# Patient Record
Sex: Female | Born: 1980 | Race: Black or African American | Hispanic: No | Marital: Single | Smoking: Current every day smoker
Health system: Southern US, Community
[De-identification: ages and names within clinical notes are randomized; demographics above are authoritative.]

## PROBLEM LIST (undated history)

## (undated) ENCOUNTER — Emergency Department (HOSPITAL_COMMUNITY): Admission: EM | Payer: Self-pay | Source: Home / Self Care

## (undated) DIAGNOSIS — I10 Essential (primary) hypertension: Secondary | ICD-10-CM

## (undated) HISTORY — DX: Essential (primary) hypertension: I10

## (undated) HISTORY — PX: CHOLECYSTECTOMY: SHX55

---

## 2021-01-25 ENCOUNTER — Emergency Department (HOSPITAL_BASED_OUTPATIENT_CLINIC_OR_DEPARTMENT_OTHER): Payer: Self-pay

## 2021-01-25 ENCOUNTER — Emergency Department (HOSPITAL_BASED_OUTPATIENT_CLINIC_OR_DEPARTMENT_OTHER)
Admission: EM | Admit: 2021-01-25 | Discharge: 2021-01-25 | Disposition: A | Discharge status: Home / Self Care | Payer: Self-pay | Attending: Emergency Medicine | Admitting: Emergency Medicine

## 2021-01-25 ENCOUNTER — Other Ambulatory Visit: Payer: Self-pay

## 2021-01-25 ENCOUNTER — Encounter (HOSPITAL_BASED_OUTPATIENT_CLINIC_OR_DEPARTMENT_OTHER): Payer: Self-pay | Admitting: Emergency Medicine

## 2021-01-25 DIAGNOSIS — R102 Pelvic and perineal pain: Secondary | ICD-10-CM | POA: Insufficient documentation

## 2021-01-25 DIAGNOSIS — A599 Trichomoniasis, unspecified: Secondary | ICD-10-CM | POA: Insufficient documentation

## 2021-01-25 DIAGNOSIS — Z79899 Other long term (current) drug therapy: Secondary | ICD-10-CM | POA: Insufficient documentation

## 2021-01-25 DIAGNOSIS — R1900 Intra-abdominal and pelvic swelling, mass and lump, unspecified site: Secondary | ICD-10-CM

## 2021-01-25 DIAGNOSIS — F17210 Nicotine dependence, cigarettes, uncomplicated: Secondary | ICD-10-CM | POA: Insufficient documentation

## 2021-01-25 LAB — CBC WITH DIFFERENTIAL/PLATELET
Abs Immature Granulocytes: 0.06 10*3/uL (ref 0.00–0.07)
Basophils Absolute: 0.1 10*3/uL (ref 0.0–0.1)
Basophils Relative: 0 %
Eosinophils Absolute: 0.1 10*3/uL (ref 0.0–0.5)
Eosinophils Relative: 1 %
HCT: 36.3 % (ref 36.0–46.0)
Hemoglobin: 11.9 g/dL — ABNORMAL LOW (ref 12.0–15.0)
Immature Granulocytes: 0 %
Lymphocytes Relative: 23 %
Lymphs Abs: 3.1 10*3/uL (ref 0.7–4.0)
MCH: 29.6 pg (ref 26.0–34.0)
MCHC: 32.8 g/dL (ref 30.0–36.0)
MCV: 90.3 fL (ref 80.0–100.0)
Monocytes Absolute: 0.8 10*3/uL (ref 0.1–1.0)
Monocytes Relative: 6 %
Neutro Abs: 9.5 10*3/uL — ABNORMAL HIGH (ref 1.7–7.7)
Neutrophils Relative %: 70 %
Platelets: 548 10*3/uL — ABNORMAL HIGH (ref 150–400)
RBC: 4.02 MIL/uL (ref 3.87–5.11)
RDW: 12.9 % (ref 11.5–15.5)
WBC: 13.6 10*3/uL — ABNORMAL HIGH (ref 4.0–10.5)
nRBC: 0 % (ref 0.0–0.2)

## 2021-01-25 LAB — LIPASE, BLOOD: Lipase: 36 U/L (ref 11–51)

## 2021-01-25 LAB — WET PREP, GENITAL
Sperm: NONE SEEN
Yeast Wet Prep HPF POC: NONE SEEN

## 2021-01-25 LAB — COMPREHENSIVE METABOLIC PANEL
ALT: 14 U/L (ref 0–44)
AST: 12 U/L — ABNORMAL LOW (ref 15–41)
Albumin: 3.4 g/dL — ABNORMAL LOW (ref 3.5–5.0)
Alkaline Phosphatase: 60 U/L (ref 38–126)
Anion gap: 8 (ref 5–15)
BUN: 8 mg/dL (ref 6–20)
CO2: 26 mmol/L (ref 22–32)
Calcium: 9 mg/dL (ref 8.9–10.3)
Chloride: 102 mmol/L (ref 98–111)
Creatinine, Ser: 0.62 mg/dL (ref 0.44–1.00)
GFR, Estimated: >60 mL/min (ref >60)
Glucose, Bld: 94 mg/dL (ref 70–99)
Potassium: 3.6 mmol/L (ref 3.5–5.1)
Sodium: 136 mmol/L (ref 135–145)
Total Bilirubin: 0.3 mg/dL (ref 0.3–1.2)
Total Protein: 9.3 g/dL — ABNORMAL HIGH (ref 6.5–8.1)

## 2021-01-25 LAB — URINALYSIS, ROUTINE W REFLEX MICROSCOPIC
Glucose, UA: NEGATIVE mg/dL
Ketones, ur: NEGATIVE mg/dL
Nitrite: NEGATIVE
Protein, ur: NEGATIVE mg/dL
Specific Gravity, Urine: 1.02 (ref 1.005–1.030)
pH: 6 (ref 5.0–8.0)

## 2021-01-25 LAB — URINALYSIS, MICROSCOPIC (REFLEX)

## 2021-01-25 LAB — PREGNANCY, URINE: Preg Test, Ur: NEGATIVE

## 2021-01-25 MED ORDER — DOXYCYCLINE HYCLATE 100 MG PO CAPS: 100.0000 mg | ORAL_CAPSULE | Freq: Two times a day (BID) | ORAL | 0 refills | Status: DC

## 2021-01-25 MED ORDER — CEFTRIAXONE SODIUM 500 MG IJ SOLR
500.0000 mg | Freq: Once | INTRAMUSCULAR | Status: AC
  Administered 2021-01-25: 500 mg via INTRAMUSCULAR
  Filled 2021-01-25: qty 500

## 2021-01-25 MED ORDER — HYDROCODONE-ACETAMINOPHEN 5-325 MG PO TABS: 2.0000 | ORAL_TABLET | ORAL | 0 refills | Status: DC | PRN

## 2021-01-25 MED ORDER — DOXYCYCLINE HYCLATE 100 MG PO TABS
100.0000 mg | ORAL_TABLET | Freq: Once | ORAL | Status: AC
  Administered 2021-01-25: 100 mg via ORAL
  Filled 2021-01-25: qty 1

## 2021-01-25 MED ORDER — MORPHINE SULFATE (PF) 4 MG/ML IV SOLN
4.0000 mg | Freq: Once | INTRAVENOUS | Status: AC
Start: 2021-01-25 — End: 2021-01-25
  Administered 2021-01-25: 4 mg via INTRAVENOUS
  Filled 2021-01-25 (×2): qty 1

## 2021-01-25 MED ORDER — ACETAMINOPHEN 325 MG PO TABS
650.0000 mg | ORAL_TABLET | Freq: Once | ORAL | Status: AC
  Administered 2021-01-25: 650 mg via ORAL
  Filled 2021-01-25: qty 2

## 2021-01-25 MED ORDER — IOHEXOL 300 MG/ML  SOLN
100.0000 mL | Freq: Once | INTRAMUSCULAR | Status: AC | PRN
  Administered 2021-01-25: 100 mL via INTRAVENOUS

## 2021-01-25 MED ORDER — METRONIDAZOLE 500 MG PO TABS
500.0000 mg | ORAL_TABLET | Freq: Once | ORAL | Status: AC
  Administered 2021-01-25: 500 mg via ORAL
  Filled 2021-01-25: qty 1

## 2021-01-25 MED ORDER — GADOBUTROL 1 MMOL/ML IV SOLN
10.0000 mL | Freq: Once | INTRAVENOUS | Status: AC | PRN
  Administered 2021-01-25: 10 mL via INTRAVENOUS

## 2021-01-25 MED ORDER — METRONIDAZOLE 500 MG PO TABS: 500.0000 mg | ORAL_TABLET | Freq: Two times a day (BID) | ORAL | 0 refills | Status: DC

## 2021-01-25 MED ORDER — SODIUM CHLORIDE 0.9 % IV BOLUS
1000.0000 mL | Freq: Once | INTRAVENOUS | Status: AC
Start: 2021-01-25 — End: 2021-01-25
  Administered 2021-01-25: 1000 mL via INTRAVENOUS

## 2021-01-25 NOTE — Consult Note (Signed)
   OB/GYN Telephone Consult  01/25/2021  Christy Schaefer is a 40 y.o. female not pregnant presenting to [facility]. I was called for a consult regarding the care of this patient by [facility]                                                          .    The provider had a clinical question about follow up and management  The provider presented the following relevant clinical information: Pt presented with pelvic and abdominal pain.  No fever or chills.  No nausea or vomiting.  Did not mention any issues with bleeding.  No other acute complaints. Mostly concerned about PID due to exam findings (cervical motion tenderness).  However, when imaging was completed there was concern for potential neoplasm.  Requesting advice about next step/follow up.  I performed a chart review on the patient and reviewed available documentation.  BP 107/72 (BP Location: Left Arm)   Pulse 92   Temp 99 F (37.2 C) (Oral)   Resp 15   Ht 5\' 4"  (1.626 m)   Wt 136.1 kg   LMP 01/13/2021   SpO2 100%   BMI 51.49 kg/m   Exam- performed by consulting provider  03/15/2021 REVIEWED- -IMPRESSION: Complex solid and cystic pelvic mass filling the pelvis. Normal structures such as the uterus/endometrium and ovaries are not distinguishable from this mass. No definite free pelvic fluid.   Findings are concerning for neoplasm. Although pelvic inflammatory disease could have a similar appearance, it is felt to be much less likely. Recommend further evaluation with MRI. Recommend gynecology consult.  Recommendations:  -MRI ordered for further evaluation -plan to follow up with GYN early next week -will also reach out to GYN oncology on Monday once MRI is back about next step  -Recommended MD/APP provide the patient with a referral to the [facility] (any office) for follow up in  1 week.   Thank you for this consult and if additional recommendations are needed please call [phone] for  the OB/GYN attending on service at [facility].   I spent approximately 10 minutes directly consulting with the provider and verbally discussing this case. Additionally 10 minutes minutes was spent performing chart review and documentation.   [facility], DO Attending Obstetrician & Gynecologist, [facility] for [facility], [facility]     Criteria for phone consult billing? (If answer to any of these are yes then you cannot bill this telephone consult) Will the patient be seen urgently (within 24hrs) at a [facility] practice? No Is this a patient on which I performed surgery within the last 7d? No Have you billed a telephone consult on this patient in the last 7d? No

## 2021-01-25 NOTE — ED Provider Notes (Signed)
[facility] EMERGENCY DEPARTMENT Provider Note   CSN: [id number] Arrival date & time: 01/25/21  1157     History Chief Complaint  Patient presents with   Abdominal Pain    Christy Schaefer is a 40 y.o. female.  Patient has a past medical history of cholecystectomy. Patient presents with 2 days of suprapubic and right lower quadrant abdominal pain.  She says the patient's pain started gradual and was initially more in the middle of her lower abdomen, however the pain has gradually worsened and is now more so on the right side of her lower abdomen.  Rates the pain 8 out of 10.  Pain is worse when lying on left side and lying flat.  Has been taking Tylenol at home with no relief.  She has never had the symptoms before.  Her last menstrual period ended 3 days ago.  She has associated nausea with no vomiting.  Decreased p.o. intake.  She denies any associated fevers or chills, chest pain, shortness of breath, diarrhea, constipation.  She says that she has pressure when she urinates, denies dysuria or hematuria.  Denies any flank pain.  She denies any vaginal bleeding or discharge.   Abdominal Pain Associated symptoms: nausea   Associated symptoms: no chest pain, no chills, no constipation, no cough, no diarrhea, no dysuria, no fever, no hematuria, no shortness of breath, no sore throat, no vaginal bleeding, no vaginal discharge and no vomiting       History reviewed. No pertinent past medical history.  There are no problems to display for this patient.   Past Surgical History:  Procedure Laterality Date   CHOLECYSTECTOMY       OB History   No obstetric history on file.     No family history on file.  Social History   Tobacco Use   Smoking status: Every Day    Types: Cigarettes   Smokeless tobacco: Never  Vaping Use   Vaping Use: Never used  Substance Use Topics   Alcohol use: Yes    Comment: rare   Drug use: Never    Home Medications Prior to Admission  medications   Medication Sig Start Date End Date Taking? Authorizing Provider  doxycycline (VIBRAMYCIN) 100 MG capsule Take 1 capsule (100 mg total) by mouth 2 (two) times daily for 14 days. 01/25/21 02/08/21 Yes Ezariah Nace, Finis Bud, PA-C  HYDROcodone-acetaminophen (NORCO/VICODIN) 5-325 MG tablet Take 2 tablets by mouth every 4 (four) hours as needed for up to 5 days. 01/25/21 01/30/21 Yes Javious Hallisey, Finis Bud, PA-C  metroNIDAZOLE (FLAGYL) 500 MG tablet Take 1 tablet (500 mg total) by mouth 2 (two) times daily for 14 days. 01/25/21 02/08/21 Yes Salaya Holtrop, Finis Bud, PA-C    Allergies    Patient has no known allergies.  Review of Systems   Review of Systems  Constitutional:  Negative for chills and fever.  HENT:  Negative for congestion, rhinorrhea and sore throat.   Eyes:  Negative for visual disturbance.  Respiratory:  Negative for cough, chest tightness and shortness of breath.   Cardiovascular:  Negative for chest pain, palpitations and leg swelling.  Gastrointestinal:  Positive for abdominal pain and nausea. Negative for abdominal distention, blood in stool, constipation, diarrhea and vomiting.  Genitourinary:  Positive for difficulty urinating and pelvic pain. Negative for decreased urine volume, dysuria, flank pain, hematuria, menstrual problem, vaginal bleeding and vaginal discharge.  Musculoskeletal:  Negative for back pain.  Skin:  Negative for rash and wound.  Neurological:  Negative  for dizziness, syncope, weakness, light-headedness and headaches.  Psychiatric/Behavioral:  Negative for confusion.   All other systems reviewed and are negative.  Physical Exam Updated Vital Signs BP 109/81   Pulse 96   Temp 99 F (37.2 C) (Oral)   Resp 18   Ht 5\' 4"  (1.626 m)   Wt 136.1 kg   LMP 01/13/2021   SpO2 100%   BMI 51.49 kg/m   Physical Exam Vitals and nursing note reviewed.  Constitutional:      General: She is not in acute distress.    Appearance: Normal appearance. She is obese.  She is not ill-appearing, toxic-appearing or diaphoretic.     Comments: Patient is tearful due to pain  HENT:     Head: Normocephalic and atraumatic.     Nose: No nasal deformity.     Mouth/Throat:     Lips: Pink. No lesions.     Mouth: No injury, lacerations, oral lesions or angioedema.     Pharynx: Uvula midline. No uvula swelling.  Eyes:     General: Gaze aligned appropriately. No scleral icterus.       Right eye: No discharge.        Left eye: No discharge.     Conjunctiva/sclera: Conjunctivae normal.     Right eye: Right conjunctiva is not injected. No exudate or hemorrhage.    Left eye: Left conjunctiva is not injected. No exudate or hemorrhage. Cardiovascular:     Rate and Rhythm: Regular rhythm. Tachycardia present.     Pulses: Normal pulses.          Radial pulses are 2+ on the right side and 2+ on the left side.       Dorsalis pedis pulses are 2+ on the right side and 2+ on the left side.     Heart sounds: Normal heart sounds, S1 normal and S2 normal. Heart sounds not distant. No murmur heard.   No friction rub. No gallop. No S3 or S4 sounds.  Pulmonary:     Effort: Pulmonary effort is normal. No accessory muscle usage or respiratory distress.     Breath sounds: Normal breath sounds. No stridor. No wheezing, rhonchi or rales.  Chest:     Chest wall: No tenderness.  Abdominal:     General: Abdomen is flat. Bowel sounds are normal. There is no distension.     Palpations: Abdomen is soft. There is no shifting dullness, fluid wave, hepatomegaly, splenomegaly, mass or pulsatile mass.     Tenderness: There is abdominal tenderness in the right lower quadrant and suprapubic area. There is guarding and rebound. There is no right CVA tenderness or left CVA tenderness. Positive signs include Rovsing's sign and McBurney's sign. Negative signs include Murphy's sign, psoas sign and obturator sign.     Comments: Patient has pretty significant guarding or rigidity to palpation of right  lower quadrant and suprapubic area.  She appears to be grimacing as I press down.  She does have some generalized abdominal tenderness in other fields, however not as severe.  Musculoskeletal:     Right lower leg: No edema.     Left lower leg: No edema.  Skin:    General: Skin is warm and dry.     Coloration: Skin is not jaundiced or pale.     Findings: No bruising, erythema, lesion or rash.  Neurological:     General: No focal deficit present.     Mental Status: She is alert and oriented to person, place, and  time.     GCS: GCS eye subscore is 4. GCS verbal subscore is 5. GCS motor subscore is 6.  Psychiatric:        Mood and Affect: Mood normal.        Behavior: Behavior normal. Behavior is cooperative.    ED Results / Procedures / Treatments   Labs (all labs ordered are listed, but only abnormal results are displayed) Labs Reviewed  WET PREP, GENITAL - Abnormal; Notable for the following components:      Result Value   Trich, Wet Prep PRESENT (*)    Clue Cells Wet Prep HPF POC PRESENT (*)    WBC, Wet Prep HPF POC MANY (*)    All other components within normal limits  URINALYSIS, ROUTINE W REFLEX MICROSCOPIC - Abnormal; Notable for the following components:   Hgb urine dipstick SMALL (*)    Bilirubin Urine SMALL (*)    Leukocytes,Ua SMALL (*)    All other components within normal limits  COMPREHENSIVE METABOLIC PANEL - Abnormal; Notable for the following components:   Total Protein 9.3 (*)    Albumin 3.4 (*)    AST 12 (*)    All other components within normal limits  CBC WITH DIFFERENTIAL/PLATELET - Abnormal; Notable for the following components:   WBC 13.6 (*)    Hemoglobin 11.9 (*)    Platelets 548 (*)    Neutro Abs 9.5 (*)    All other components within normal limits  URINALYSIS, MICROSCOPIC (REFLEX) - Abnormal; Notable for the following components:   Bacteria, UA MANY (*)    Trichomonas, UA PRESENT (*)    All other components within normal limits  PREGNANCY, URINE   LIPASE, BLOOD  CA 125  GC/CHLAMYDIA PROBE AMP ([place]) NOT AT [facility]    EKG None  Radiology CT Abdomen Pelvis W Contrast  Result Date: 01/25/2021 CLINICAL DATA:  RIGHT LOWER QUADRANT abdominal pain. Suspected appendicitis. EXAM: CT ABDOMEN AND PELVIS WITH CONTRAST TECHNIQUE: Multidetector CT imaging of the abdomen and pelvis was performed using the standard protocol following bolus administration of intravenous contrast. CONTRAST:  OMNIPAQUE IOHEXOL 300 MG/ML  SOLN COMPARISON:  05/16/2018 FINDINGS: Lower chest: Subsegmental atelectasis at both lung bases. Hepatobiliary: Prior cholecystectomy. There is a circumscribed low-attenuation lesion in the LEFT hepatic lobe adjacent to the falciform ligament which measures 3.5 centimeters (image 32 of series 2). Although this may represent focal fat adjacent to the falciform ligament, further evaluation is indicated. Pancreas: Unremarkable. No pancreatic ductal dilatation or surrounding inflammatory changes. Spleen: Normal in size without focal abnormality. Adrenals/Urinary Tract: Adrenal glands are normal. Kidneys are normal. Ureters are unremarkable. Bladder is decompressed and thick-walled. Stomach/Bowel: Stomach is normal in appearance. Small bowel loops are unremarkable. The appendix is well seen and normal in appearance. Colon is unremarkable. Vascular/Lymphatic: There is normal vascular opacification of the celiac axis, superior mesenteric artery, and inferior mesenteric artery. Normal appearance of the portal venous system and inferior vena cava. There are numerous enlarged retroperitoneal lymph nodes, largest in the LEFT periaortic region measuring 1.4 centimeters on image 39 of series 2. Enlarged lymph nodes identified in the RIGHT internal iliac chain, largest 1.0 centimeters on image 70 of series 2. There is mild stranding in the retroperitoneal para-aortic fat. Reproductive: Intrauterine device has been removed. There is a large complex  mass within the pelvis which includes the uterus. Multiple thick-walled confluent collections measure up to 10.2 x 6.3 x 9.1 centimeters. Normal ovarian tissue is not discrete from this mass. Measured  together, these confluent low-attenuation masses and the uterus span 14.8 x 10.2 x 20.9 centimeters. The appearance is progressive compared with the prior study. Other: Possible small amount of ascites versus fluid-filled structure in the cul-de-sac. Abdominal wall is unremarkable. Musculoskeletal: No acute or significant osseous findings. IMPRESSION: 1. Large confluent pelvic mass including the uterus and numerous thick walled cystic masses which measure up to 10 centimeters in diameter. Normal ovarian tissue is difficult to identify discrete from this confluent mass. Findings are suspicious for neoplasm. Less likely, progressive inflammatory/infectious process could have this appearance. Recommend GYN referral and possible MRI. Consider ultrasound in the acute setting. 2. 3.5 centimeter low-attenuation lesion in the LEFT hepatic lobe warranting further characterization. Recommend MRI of the abdomen with liver protocol. 3. Numerous enlarged retroperitoneal lymph nodes suspicious for metastatic disease. 4. Normal appendix. 5. Cholecystectomy. These results were called by telephone at the time of interpretation on 01/25/2021 at 2:50 pm to the ED MD, who verbally acknowledged these results. Electronically Signed   By: Norva Pavlov M.D.   On: 01/25/2021 14:51   US PELVIC COMPLETE W TRANSVAGINAL AND TORSION R/O  Result Date: 01/25/2021 CLINICAL DATA:  Pelvic pain, pelvic inflammatory disease. Abscess/mass seen on CT exam. LMP 01/10/2021 EXAM: TRANSABDOMINAL AND TRANSVAGINAL ULTRASOUND OF PELVIS TECHNIQUE: Both transabdominal and transvaginal ultrasound examinations of the pelvis were performed. Transabdominal technique was performed for global imaging of the pelvis including uterus, ovaries, adnexal regions, and  pelvic cul-de-sac. It was necessary to proceed with endovaginal exam following the transabdominal exam to visualize the uterus, endometrium, ovaries and adnexal regions. COMPARISON:  CT of the abdomen and pelvis on 01/25/2021, ultrasound of the pelvis on 05/12/2018 FINDINGS: Uterus Measurements: The uterus is indistinguishable from large pelvic mass containing numerous complex cystic structures, as seen on CT exam. Endometrium Thickness: Not identified. Right ovary Measurements: Indistinguishable from large complex pelvic mass. Left ovary Measurements: Indistinguishable from large complex pelvic mass. Other findings No free pelvic fluid. IMPRESSION: Complex solid and cystic pelvic mass filling the pelvis. Normal structures such as the uterus/endometrium and ovaries are not distinguishable from this mass. No definite free pelvic fluid. Findings are concerning for neoplasm. Although pelvic inflammatory disease could have a similar appearance, it is felt to be much less likely. Recommend further evaluation with MRI. Recommend gynecology consult. Electronically Signed   By: Norva Pavlov M.D.   On: 01/25/2021 16:17    Procedures Procedures   Medications Ordered in ED Medications  acetaminophen (TYLENOL) tablet 650 mg (650 mg Oral Given 01/25/21 1312)  sodium chloride 0.9 % bolus 1,000 mL ( Intravenous Stopped 01/25/21 1453)  iohexol (OMNIPAQUE) 300 MG/ML solution 100 mL (100 mLs Intravenous Contrast Given 01/25/21 1413)  cefTRIAXone (ROCEPHIN) injection 500 mg (500 mg Intramuscular Given 01/25/21 1636)  doxycycline (VIBRA-TABS) tablet 100 mg (100 mg Oral Given 01/25/21 1629)  metroNIDAZOLE (FLAGYL) tablet 500 mg (500 mg Oral Given 01/25/21 1629)  morphine 4 MG/ML injection 4 mg (4 mg Intravenous Given 01/25/21 1846)  gadobutrol (GADAVIST) 1 MMOL/ML injection 10 mL (10 mLs Intravenous Contrast Given 01/25/21 1835)    ED Course  I have reviewed the triage vital signs and the nursing  notes.  Pertinent labs & imaging results that were available during my care of the patient were reviewed by me and considered in my medical decision making (see chart for details).  Clinical Course as of 01/25/21 1943  Sat Jan 25, 2021  1359 Leukocytosis, u preg negative [GL]  1359 Mild anemia.  Thrombocytosis.   [  GL]  1523  Large confluent pelvic mass including the uterus and numerous thick walled cystic masses which measure up to 10 centimeters in diameter. Normal ovarian tissue is difficult to identify discrete from this confluent mass. Findings are suspicious for neoplasm. Less likely, progressive inflammatory/infectious process could have this appearance. Recommend GYN referral and possible MRI. Consider ultrasound in the acute setting.   [GL]  1524 3.5 centimeter low-attenuation lesion in the LEFT hepatic lobe warranting further characterization. Recommend MRI of the abdomen with liver protocol.   [GL]  1524 Numerous enlarged retroperitoneal lymph nodes suspicious for metastatic disease.   [GL]  1524 Pelvic exam with Cervical motion tenderness. No purulent discharge. Wet prep, Gon/Chlamydia obtained [GL]  1742 Spoke with Dr. Charlotta Newton with GYN. She favors TOA versus neoplasm based on patient's age. She recommends trial of discharge on two weeks of antibiotics with close OBGYN f/u. Strict return precautions for failure of outpatient therapy [GL]    Clinical Course User Index [GL] Noralee Dutko, Finis Bud, PA-C   MDM Rules/Calculators/A&P                         Initially presented with 2 days of worsening pelvic and right lower quadrant abdominal pain. Patient with tachycardia to 118, low-grade temp of 99.  Some signs of hypovolemia.  Will rehydrate.  Peritoneal signs of palpation of right lower quadrant and suprapubic area concerning for possible appendicitis or ovarian etiology.  Also want to rule out urinary tract infection. Ordered U preg, UA, CMP, lipase, CBC, CT abdomen pelvis.   Treat pain with Tylenol for now.  We will give other medication following pregnancy test.  Give 1 L IV fluid bolus.   I personally reviewed all laboratory work and imaging.  -CBC with mild leukocytosis.  Stable anemia from baseline.  CMP with no kidney abnormality.  Liver enzymes look okay.  Electrolytes normal.  Face normal. -Pregnancy negative -Urine with small amount of leukocytes, however not super concerning for urinary tract infection, however she did test positive for trichomonas in her urine. -CT scan was specifically concerning.  A large confluent pelvic mass including the uterus and numerous thick-walled cystic masses which measure up to 10 cm in diameter were found.  Normal ovarian tissue was difficult to identify discrete from this confluent mass.  Findings are suspicious for neoplasm.  However, inflammatory or infectious process cannot be excluded.  Also findings are concerning for 3.5 cm low-attenuation lesion in the left hepatic lobe that needs further characterization.  Patient also had numerous enlarged retroperitoneal lymph nodes that are suspicious for metastatic disease.  Performed pelvic exam with cervical motion tenderness.  No large amount of purulent discharge.  Wet prep gonorrhea and chlamydia were obtained.  , Wet prep positive for trichomonas and clue cells.  I have started patient on an IM injection of 500 mg Rocephin, as well as started on doxycycline and Flagyl to treat infection.  Given these findings, I am concerned for pelvic inflammatory disease, however I am also concerned for metastatic cancerous mass versus ovarian abscess.  Will obtain transvaginal and pelvic ultrasound to further characterize the mass found on CT scan. Ordered CA 125. This will be a send out and will not result today.  , Ultrasound concerning for complex solid and cystic pelvic mass filling the felt pelvis.  No free pelvic fluid noted.  Findings were concerning for neoplasm although PID  could have similar appearance. recommend MRI. MRI ordered.   , I  spoke with Dr. Charlotta Newton with GYN.  Discussed this case with her, and she favors tubo-ovarian abscess over neoplasm due to patient's age and presenting symptoms.  Since patient is hemodynamically stable, she feels that a trial of outpatient oral antibiotic therapy is appropriate with close OB/GYN follow-up.  Strict return precautions given.  On reassessment, pain is improved with IV morphine.  Tachycardia was improved after fluid ministration.  I discussed these results and the plan with the patient.  She plans to call OB/GYN office on Monday morning as soon as she can.  She understands that she may develop worsening symptoms, and that she needs to return to the emergency department for possibility of IV antibiotics and admission.  I discussed that patient still has pending chlamydia and gonorrhea testing.  She is already been treated for this, however someone will call her if these return positive and she needs to alert any sexual partners of these results as they would also need to be treated.  At the time of discharge, MRI results are still pending.  Spoke with radiologist and these would not be read until tomorrow morning.  It would be available for patient's OB/GYN to review.  Even though we favor PID with POA over neoplasm, this does not exclude possibility of underlying malignancy being the cause of patient's symptoms, so it is very important for her to have close follow-up for reevaluation..  Portions of this note were generated with Scientist, clinical (histocompatibility and immunogenetics). Dictation errors may occur despite best attempts at proofreading.   Final Clinical Impression(s) / ED Diagnoses Final diagnoses:  Trichomonas infection  Pelvic pain in female    Rx / DC Orders ED Discharge Orders          Ordered    doxycycline (VIBRAMYCIN) 100 MG capsule  2 times daily        01/25/21 1757    metroNIDAZOLE (FLAGYL) 500 MG tablet  2 times daily         01/25/21 1757    HYDROcodone-acetaminophen (NORCO/VICODIN) 5-325 MG tablet  Every 4 hours PRN        01/25/21 1757             Claudie Leach, PA-C 01/25/21 1943    Virgina Norfolk, DO 01/25/21 2035

## 2021-01-25 NOTE — Discharge Instructions (Addendum)
We are treating you for an infection called Pelvic Inflammatory Disease. We found some concerning findings on imaging that were concerning for an abscess or a pelvic mass.   [facility] need to call [facility] first thing Monday morning and set up an appointment as early as you can.   Please take all of your antibiotics until finished!   You may develop abdominal discomfort or diarrhea from the antibiotic.  You may help offset this with probiotics which you can buy or get in yogurt. Do not eat  or take the probiotics until 2 hours after your antibiotic.  If you are not improving and continue to worsen, you should return to the Emergency Department for consideration of possible admission.

## 2021-01-25 NOTE — ED Notes (Addendum)
Correction- pt was in [facility]

## 2021-01-25 NOTE — ED Triage Notes (Signed)
Pt c/o lower abdominal pain since Thursday. Pt pain with urination and bowel movements. Pt also has loss of appetite.

## 2021-01-25 NOTE — ED Notes (Signed)
Pt currently in US.

## 2021-01-25 NOTE — ED Notes (Signed)
Patient transported to MRI 

## 2021-01-27 ENCOUNTER — Encounter (HOSPITAL_BASED_OUTPATIENT_CLINIC_OR_DEPARTMENT_OTHER): Payer: Self-pay

## 2021-01-27 ENCOUNTER — Other Ambulatory Visit: Payer: Self-pay

## 2021-01-27 DIAGNOSIS — R1031 Right lower quadrant pain: Secondary | ICD-10-CM | POA: Insufficient documentation

## 2021-01-27 DIAGNOSIS — Z5321 Procedure and treatment not carried out due to patient leaving prior to being seen by health care provider: Secondary | ICD-10-CM | POA: Insufficient documentation

## 2021-01-27 LAB — URINALYSIS, ROUTINE W REFLEX MICROSCOPIC
Bilirubin Urine: NEGATIVE
Glucose, UA: NEGATIVE mg/dL
Ketones, ur: NEGATIVE mg/dL
Nitrite: NEGATIVE
Protein, ur: 30 mg/dL — AB
Specific Gravity, Urine: 1.02 (ref 1.005–1.030)
pH: 6.5 (ref 5.0–8.0)

## 2021-01-27 LAB — COMPREHENSIVE METABOLIC PANEL
ALT: 12 U/L (ref 0–44)
AST: 12 U/L — ABNORMAL LOW (ref 15–41)
Albumin: 3.2 g/dL — ABNORMAL LOW (ref 3.5–5.0)
Alkaline Phosphatase: 55 U/L (ref 38–126)
Anion gap: 9 (ref 5–15)
BUN: 7 mg/dL (ref 6–20)
CO2: 25 mmol/L (ref 22–32)
Calcium: 8.9 mg/dL (ref 8.9–10.3)
Chloride: 102 mmol/L (ref 98–111)
Creatinine, Ser: 0.57 mg/dL (ref 0.44–1.00)
GFR, Estimated: >60 mL/min (ref >60)
Glucose, Bld: 101 mg/dL — ABNORMAL HIGH (ref 70–99)
Potassium: 3 mmol/L — ABNORMAL LOW (ref 3.5–5.1)
Sodium: 136 mmol/L (ref 135–145)
Total Bilirubin: 0.3 mg/dL (ref 0.3–1.2)
Total Protein: 9.2 g/dL — ABNORMAL HIGH (ref 6.5–8.1)

## 2021-01-27 LAB — CBC
HCT: 34.8 % — ABNORMAL LOW (ref 36.0–46.0)
Hemoglobin: 11.3 g/dL — ABNORMAL LOW (ref 12.0–15.0)
MCH: 29 pg (ref 26.0–34.0)
MCHC: 32.5 g/dL (ref 30.0–36.0)
MCV: 89.5 fL (ref 80.0–100.0)
Platelets: 393 10*3/uL (ref 150–400)
RBC: 3.89 MIL/uL (ref 3.87–5.11)
RDW: 12.8 % (ref 11.5–15.5)
WBC: 12.5 10*3/uL — ABNORMAL HIGH (ref 4.0–10.5)
nRBC: 0 % (ref 0.0–0.2)

## 2021-01-27 LAB — GC/CHLAMYDIA PROBE AMP (~~LOC~~) NOT AT ARMC
Chlamydia: NEGATIVE
Comment: NEGATIVE
Comment: NORMAL
Neisseria Gonorrhea: NEGATIVE

## 2021-01-27 LAB — LIPASE, BLOOD: Lipase: 32 U/L (ref 11–51)

## 2021-01-27 LAB — URINALYSIS, MICROSCOPIC (REFLEX)

## 2021-01-27 LAB — PREGNANCY, URINE: Preg Test, Ur: NEGATIVE

## 2021-01-27 NOTE — ED Triage Notes (Signed)
Pt c/o right lower abd pain started 10/20-states she was seen here 2 day ago-dx with "pelvic inflammatory disease"-states she is taking abx-feels worse-NAD-steady gait

## 2021-01-28 ENCOUNTER — Telehealth: Payer: Self-pay | Admitting: General Practice

## 2021-01-28 ENCOUNTER — Emergency Department (HOSPITAL_BASED_OUTPATIENT_CLINIC_OR_DEPARTMENT_OTHER)
Admission: EM | Admit: 2021-01-28 | Discharge: 2021-01-28 | Disposition: A | Discharge status: Home / Self Care | Payer: Self-pay | Attending: Emergency Medicine | Admitting: Emergency Medicine

## 2021-01-28 LAB — CA 125: Cancer Antigen (CA) 125: 11.9 U/mL (ref 0.0–38.1)

## 2021-01-28 NOTE — Telephone Encounter (Signed)
-----   Message from Levie Heritage, DO sent at 01/27/2021  3:37 PM EDT ----- Regarding: f/u appt. I'm sorry if this is a duplicate - this patient needs a follow up appt in the office later this week. Okay to overbook.  JS

## 2021-01-29 ENCOUNTER — Ambulatory Visit (INDEPENDENT_AMBULATORY_CARE_PROVIDER_SITE_OTHER): Payer: Self-pay | Admitting: Obstetrics & Gynecology

## 2021-01-29 ENCOUNTER — Other Ambulatory Visit: Payer: Self-pay

## 2021-01-29 ENCOUNTER — Other Ambulatory Visit: Payer: Self-pay | Admitting: Obstetrics & Gynecology

## 2021-01-29 ENCOUNTER — Encounter: Payer: Self-pay | Admitting: Obstetrics & Gynecology

## 2021-01-29 ENCOUNTER — Other Ambulatory Visit (HOSPITAL_COMMUNITY)
Admission: RE | Admit: 2021-01-29 | Discharge: 2021-01-29 | Disposition: A | Discharge status: Home / Self Care | Payer: Self-pay | Source: Ambulatory Visit | Attending: Obstetrics & Gynecology | Admitting: Obstetrics & Gynecology

## 2021-01-29 ENCOUNTER — Inpatient Hospital Stay (HOSPITAL_COMMUNITY)
Admission: AD | Admit: 2021-01-29 | Discharge: 2021-02-05 | DRG: 757 | Disposition: A | Discharge status: Home / Self Care | Payer: Self-pay | Attending: Family Medicine | Admitting: Family Medicine

## 2021-01-29 ENCOUNTER — Encounter (HOSPITAL_COMMUNITY): Payer: Self-pay | Admitting: Family Medicine

## 2021-01-29 VITALS — BP 120/88 | HR 107 | Ht 65.0 in | Wt 233.1 lb

## 2021-01-29 DIAGNOSIS — N739 Female pelvic inflammatory disease, unspecified: Secondary | ICD-10-CM | POA: Insufficient documentation

## 2021-01-29 DIAGNOSIS — Z9049 Acquired absence of other specified parts of digestive tract: Secondary | ICD-10-CM

## 2021-01-29 DIAGNOSIS — Z6841 Body Mass Index (BMI) 40.0 and over, adult: Secondary | ICD-10-CM

## 2021-01-29 DIAGNOSIS — R188 Other ascites: Secondary | ICD-10-CM

## 2021-01-29 DIAGNOSIS — B3731 Acute candidiasis of vulva and vagina: Secondary | ICD-10-CM | POA: Diagnosis present

## 2021-01-29 DIAGNOSIS — N7093 Salpingitis and oophoritis, unspecified: Principal | ICD-10-CM | POA: Diagnosis present

## 2021-01-29 DIAGNOSIS — A599 Trichomoniasis, unspecified: Secondary | ICD-10-CM | POA: Diagnosis present

## 2021-01-29 DIAGNOSIS — A5901 Trichomonal vulvovaginitis: Secondary | ICD-10-CM | POA: Diagnosis present

## 2021-01-29 DIAGNOSIS — R102 Pelvic and perineal pain: Secondary | ICD-10-CM | POA: Diagnosis present

## 2021-01-29 DIAGNOSIS — I10 Essential (primary) hypertension: Secondary | ICD-10-CM | POA: Clinically undetermined

## 2021-01-29 DIAGNOSIS — F17210 Nicotine dependence, cigarettes, uncomplicated: Secondary | ICD-10-CM | POA: Diagnosis present

## 2021-01-29 DIAGNOSIS — K651 Peritoneal abscess: Secondary | ICD-10-CM

## 2021-01-29 DIAGNOSIS — L0291 Cutaneous abscess, unspecified: Secondary | ICD-10-CM

## 2021-01-29 LAB — COMPREHENSIVE METABOLIC PANEL
ALT: 15 U/L (ref 0–44)
AST: 17 U/L (ref 15–41)
Albumin: 2.9 g/dL — ABNORMAL LOW (ref 3.5–5.0)
Alkaline Phosphatase: 54 U/L (ref 38–126)
Anion gap: 11 (ref 5–15)
BUN: 6 mg/dL (ref 6–20)
CO2: 25 mmol/L (ref 22–32)
Calcium: 9 mg/dL (ref 8.9–10.3)
Chloride: 102 mmol/L (ref 98–111)
Creatinine, Ser: 0.69 mg/dL (ref 0.44–1.00)
GFR, Estimated: >60 mL/min (ref >60)
Glucose, Bld: 88 mg/dL (ref 70–99)
Potassium: 3.1 mmol/L — ABNORMAL LOW (ref 3.5–5.1)
Sodium: 138 mmol/L (ref 135–145)
Total Bilirubin: 0.5 mg/dL (ref 0.3–1.2)
Total Protein: 8.9 g/dL — ABNORMAL HIGH (ref 6.5–8.1)

## 2021-01-29 LAB — DIFFERENTIAL
Abs Immature Granulocytes: 0.04 10*3/uL (ref 0.00–0.07)
Basophils Absolute: 0.1 10*3/uL (ref 0.0–0.1)
Basophils Relative: 0 %
Eosinophils Absolute: 0.1 10*3/uL (ref 0.0–0.5)
Eosinophils Relative: 1 %
Immature Granulocytes: 0 %
Lymphocytes Relative: 24 %
Lymphs Abs: 3.3 10*3/uL (ref 0.7–4.0)
Monocytes Absolute: 0.9 10*3/uL (ref 0.1–1.0)
Monocytes Relative: 7 %
Neutro Abs: 9.3 10*3/uL — ABNORMAL HIGH (ref 1.7–7.7)
Neutrophils Relative %: 68 %

## 2021-01-29 LAB — CBC
HCT: 35.3 % — ABNORMAL LOW (ref 36.0–46.0)
Hemoglobin: 11.6 g/dL — ABNORMAL LOW (ref 12.0–15.0)
MCH: 29.4 pg (ref 26.0–34.0)
MCHC: 32.9 g/dL (ref 30.0–36.0)
MCV: 89.4 fL (ref 80.0–100.0)
Platelets: 507 10*3/uL — ABNORMAL HIGH (ref 150–400)
RBC: 3.95 MIL/uL (ref 3.87–5.11)
RDW: 12.6 % (ref 11.5–15.5)
WBC: 13.7 10*3/uL — ABNORMAL HIGH (ref 4.0–10.5)
nRBC: 0 % (ref 0.0–0.2)

## 2021-01-29 LAB — C-REACTIVE PROTEIN: CRP: 11 mg/dL — ABNORMAL HIGH (ref <1.0)

## 2021-01-29 MED ORDER — SENNOSIDES-DOCUSATE SODIUM 8.6-50 MG PO TABS: 1.0000 | ORAL_TABLET | Freq: Every evening | ORAL | Status: DC | PRN

## 2021-01-29 MED ORDER — LACTATED RINGERS IV SOLN: INTRAVENOUS | Status: DC

## 2021-01-29 MED ORDER — SODIUM CHLORIDE 0.9 % IV SOLN: 3.0000 g | Freq: Four times a day (QID) | INTRAVENOUS | Status: DC

## 2021-01-29 MED ORDER — PRENATAL MULTIVITAMIN CH
1.0000 | ORAL_TABLET | Freq: Every day | ORAL | Status: DC
  Administered 2021-01-31 – 2021-02-05 (×4): 1 via ORAL
  Filled 2021-01-29 (×5): qty 1

## 2021-01-29 MED ORDER — ALUM & MAG HYDROXIDE-SIMETH 200-200-20 MG/5ML PO SUSP
15.0000 mL | ORAL | Status: DC | PRN
  Filled 2021-01-29: qty 30

## 2021-01-29 MED ORDER — HYDROMORPHONE HCL 1 MG/ML IJ SOLN
1.0000 mg | INTRAMUSCULAR | Status: DC | PRN
Start: 2021-01-29 — End: 2021-02-05
  Administered 2021-02-02 – 2021-02-04 (×7): 1 mg via INTRAVENOUS
  Filled 2021-01-29 (×8): qty 1

## 2021-01-29 MED ORDER — OXYCODONE-ACETAMINOPHEN 5-325 MG PO TABS
1.0000 | ORAL_TABLET | ORAL | Status: DC | PRN
  Administered 2021-01-29 – 2021-02-02 (×9): 2 via ORAL
  Administered 2021-02-03: 1 via ORAL
  Administered 2021-02-04 – 2021-02-05 (×5): 2 via ORAL
  Filled 2021-01-29 (×13): qty 2
  Filled 2021-01-29 (×2): qty 1
  Filled 2021-01-29: qty 2

## 2021-01-29 MED ORDER — METRONIDAZOLE 500 MG/100ML IV SOLN
500.0000 mg | Freq: Four times a day (QID) | INTRAVENOUS | Status: DC
  Administered 2021-01-29 – 2021-01-30 (×2): 500 mg via INTRAVENOUS
  Filled 2021-01-29 (×4): qty 100

## 2021-01-29 MED ORDER — KETOROLAC TROMETHAMINE 30 MG/ML IJ SOLN
30.0000 mg | Freq: Four times a day (QID) | INTRAMUSCULAR | Status: DC
  Administered 2021-01-29 – 2021-02-01 (×9): 30 mg via INTRAVENOUS
  Filled 2021-01-29 (×10): qty 1

## 2021-01-29 MED ORDER — ZOLPIDEM TARTRATE 5 MG PO TABS
5.0000 mg | ORAL_TABLET | Freq: Every evening | ORAL | Status: DC | PRN
  Administered 2021-02-01: 5 mg via ORAL
  Filled 2021-01-29: qty 1

## 2021-01-29 MED ORDER — HYDROMORPHONE HCL 1 MG/ML IJ SOLN
0.5000 mg | INTRAMUSCULAR | Status: DC | PRN
Start: 2021-01-29 — End: 2021-01-29

## 2021-01-29 MED ORDER — KETOROLAC TROMETHAMINE 30 MG/ML IJ SOLN
30.0000 mg | Freq: Four times a day (QID) | INTRAMUSCULAR | Status: DC
Start: 2021-01-29 — End: 2021-02-01

## 2021-01-29 MED ORDER — DOXYCYCLINE HYCLATE 100 MG IV SOLR
100.0000 mg | Freq: Two times a day (BID) | INTRAVENOUS | Status: DC
Start: 2021-01-29 — End: 2021-02-01
  Administered 2021-01-29 – 2021-02-01 (×6): 100 mg via INTRAVENOUS
  Filled 2021-01-29 (×7): qty 100

## 2021-01-29 MED ORDER — PRENATAL MULTIVITAMIN CH: 1.0000 | ORAL_TABLET | Freq: Every day | ORAL | Status: DC

## 2021-01-29 MED ORDER — METRONIDAZOLE 500 MG/100ML IV SOLN
500.0000 mg | Freq: Four times a day (QID) | INTRAVENOUS | Status: DC
  Filled 2021-01-29 (×3): qty 100

## 2021-01-29 MED ORDER — SODIUM CHLORIDE 0.9 % IV SOLN
1.0000 g | Freq: Two times a day (BID) | INTRAVENOUS | Status: DC
  Administered 2021-01-29: 1 g via INTRAVENOUS
  Filled 2021-01-29 (×3): qty 1

## 2021-01-29 NOTE — H&P (Signed)
Christy Schaefer is an 40 y.o. female who has failed outpatient treatment for bilateral TOA. Pt continues to have diffuse lower pelvic pain, main right sided. Was Dx with TOA 01/25/21. She denies fever or chills. Some N/V  LMP 01/22/21  Patient's last menstrual period was 01/22/2021 (approximate).    History reviewed. No pertinent past medical history.  Past Surgical History:  Procedure Laterality Date   CHOLECYSTECTOMY      No family history on file.  Social History:  reports that she has been smoking cigarettes. She has been smoking an average of .75 packs per day. She has never used smokeless tobacco. She reports current alcohol use. She reports that she does not use drugs.  Allergies: No Known Allergies  Medications Prior to Admission  Medication Sig Dispense Refill Last Dose   doxycycline (VIBRAMYCIN) 100 MG capsule Take 1 capsule (100 mg total) by mouth 2 (two) times daily for 14 days. 28 capsule 0    HYDROcodone-acetaminophen (NORCO/VICODIN) 5-325 MG tablet Take 2 tablets by mouth every 4 (four) hours as needed for up to 5 days. 10 tablet 0    metroNIDAZOLE (FLAGYL) 500 MG tablet Take 1 tablet (500 mg total) by mouth 2 (two) times daily for 14 days. 28 tablet 0     Review of Systems  Constitutional:  Positive for appetite change.  Gastrointestinal:  Positive for abdominal pain.  Genitourinary:  Positive for pelvic pain.   Blood pressure 124/85, pulse 94, temperature 98.8 F (37.1 C), temperature source Oral, resp. rate 18, last menstrual period 01/22/2021, SpO2 100 %. Physical Exam Constitutional:      Appearance: Normal appearance.  Cardiovascular:     Rate and Rhythm: Normal rate and regular rhythm.  Pulmonary:     Effort: Pulmonary effort is normal.     Breath sounds: Normal breath sounds.  Abdominal:     General: Bowel sounds are normal.     Palpations: Abdomen is soft.     Comments: Diffuse lower abd tenderness, mainly right sided  Genitourinary:    Comments:  Deferred   No results found for this or any previous visit (from the past 24 hour(s)).  No results found.  Assessment/Plan: Bilateral TOA, failed outpt treatment  Pt will be admitted for IV antibiotics and IR consult for consideration of drainage of TOA's. POC reviewed with pt. Pt verbalized understanding  Hermina Staggers 01/29/2021, 5:03 PM

## 2021-01-29 NOTE — Progress Notes (Signed)
Patient ID: Christy Schaefer, female   DOB: [birth year], 40 y.o.   MRN: [id number]  Chief Complaint  Patient presents with   Pelvic Inflammatory Disease    HPI Christy Schaefer is a 40 y.o. female.  No obstetric history on file. Patient's last menstrual period was 01/22/2021 (approximate). Patient was seen inED at [facility] 10/22 with abdominal pain and Dx of TOA was made after imaging including [place], CT and MRI. She was discharged on PO antibiotic on the recommendation of Dr. Charlotta Newton. Her pain is not improved and she has decreased appetite having nothing to eat since yesterday. HPI  No past medical history on file.  Past Surgical History:  Procedure Laterality Date   CHOLECYSTECTOMY      No family history on file.  Social History Social History   Tobacco Use   Smoking status: Every Day    Types: Cigarettes   Smokeless tobacco: Never  Vaping Use   Vaping Use: Never used  Substance Use Topics   Alcohol use: Yes    Comment: rare   Drug use: Never    No Known Allergies  Current Outpatient Medications  Medication Sig Dispense Refill   doxycycline (VIBRAMYCIN) 100 MG capsule Take 1 capsule (100 mg total) by mouth 2 (two) times daily for 14 days. 28 capsule 0   HYDROcodone-acetaminophen (NORCO/VICODIN) 5-325 MG tablet Take 2 tablets by mouth every 4 (four) hours as needed for up to 5 days. 10 tablet 0   metroNIDAZOLE (FLAGYL) 500 MG tablet Take 1 tablet (500 mg total) by mouth 2 (two) times daily for 14 days. 28 tablet 0   No current facility-administered medications for this visit.    Review of Systems Review of Systems  Constitutional:  Positive for appetite change.  Respiratory: Negative.    Cardiovascular: Negative.   Gastrointestinal:  Positive for abdominal pain. Negative for diarrhea.  Genitourinary:  Positive for pelvic pain. Negative for vaginal bleeding.   Blood pressure 120/88, pulse (!) 107, height 5\' 5"  (1.651 m), weight 233 lb 1.6 oz (105.7 kg), last menstrual period  01/22/2021.  Physical Exam Physical Exam Constitutional:      Appearance: Normal appearance.  Cardiovascular:     Rate and Rhythm: Tachycardia present.  Pulmonary:     Effort: Pulmonary effort is normal.  Abdominal:     Palpations: Abdomen is soft. There is no mass.     Tenderness: There is abdominal tenderness. There is no guarding.  Skin:    General: Skin is warm and dry.  Neurological:     Mental Status: She is alert.  Psychiatric:        Mood and Affect: Mood normal.        Behavior: Behavior normal.    Data Reviewed CLINICAL DATA:  A 40 year old female presents with a pelvic mass and hepatic mass, knee for additional characterization.   EXAM: MRI ABDOMEN AND PELVIS WITHOUT AND WITH CONTRAST   TECHNIQUE: Multiplanar multisequence MR imaging of the abdomen and pelvis was performed both before and after the administration of intravenous contrast.   CONTRAST:  10mL GADAVIST GADOBUTROL 1 MMOL/ML IV SOLN   COMPARISON:  Comparison is made with May 16, 2018 and with most recent CT of the abdomen and pelvis from January 25, 2021.   FINDINGS: COMBINED FINDINGS FOR BOTH MR ABDOMEN AND PELVIS   Lower chest: No effusion, no consolidative changes at the lung bases, limited assessment on MRI.   Hepatobiliary: Subtle area of low attenuation in hepatic subsegment IV B on the  CT evaluation may have subtle microscopic lipid. Overall there is no substantial iron or fat deposition in the liver and the liver displays smooth contours. No focal, suspicious lesion elsewhere in the liver.   Enhancement characteristics without hyperenhancement. Area is hypoenhancing on the current study. Area likely present and little changed as far back as February 2020. Currently this measures 2.7 x 2.5 cm. Previously this measured 2.7 x 2.5 cm.   Portal vein is patent. Hepatic veins are patent. Post cholecystectomy without biliary duct distension.   Pancreas: Normal intrinsic T1 signal.  No ductal dilation or sign of inflammation. No focal lesion.   Spleen:  Normal spleen.   Adrenals/Urinary Tract:  Adrenal glands are normal.   Symmetric renal enhancement without hydronephrosis.   Stomach/Bowel: Unremarkable to the extent evaluated on MRI not performed for bowel evaluation. Normal appendix.   Rectum is closely associated with cystic and peripherally enhancing fluid-filled areas in the pelvis. No sign of bowel obstruction.   Vascular/Lymphatic: Patent abdominal and pelvic vasculature. Increasing size in LEFT para-aortic lymph nodes compared to the study of February of 2010. Largest in the range of 14 mm (image 51/17) intra-aortocaval lymph nodes also with increased size and number since that time also displayed on 6 the CT evaluation acquired just prior to the MRI.   Reproductive: Multilobular and multiloculated cystic areas in the pelvis extending anterior to the uterus between uterus and urinary bladder and also into the cul-de-sac. There were some cystic changes as far back as 2020. At that time these were seen in the context of an inflammatory process and malpositioned IUD. A complex picture is associated with stranding in the rectovaginal region and stranding about the uterus and RIGHT adnexa in particular. In total RIGHT adnexal/juxta uterine area measuring approximally 12.6 x 8.6 cm greatest axial dimension and approximately 17 cm greatest craniocaudal dimension. There is an area within the midst of this multiloculated collection that measures approximately 8.2 x 7.5 x 8.3 cm and shows a low signal rim on T2.   Process tracks into the LEFT adnexa as well with a similar area showing low T2 signal around the rim and in this area however there is intrinsic T1 hyperintensity without definitive signs of enhancement. This area measuring approximately 2.6 x 2.2 cm in the axial plane. Scattered areas of T1 hyperintensity along the RIGHT superolateral margin of  the dominant cystic area in the RIGHT pelvis and in the cul-de-sac to the RIGHT of the uterus. There are thick-walled areas with enhancement throughout the lesion.   Other:  No ascites in the upper abdomen.   Musculoskeletal: No suspicious bone lesions identified.   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 Suggest follow-up MRI utilizing Eovist which would be helpful to differentiate between these 2 entities. This could be performed on a nonemergent basis.   Multi cystic multiloculated pelvic process which is suspicious for TOA/PID with concomitant concern for pelvic neoplasm and or endometriosis. There is a background of endometriosis in this patient but given the complexity of the current findings concomitant neoplasm is also given strong consideration at this time. Multiphase imaging of the pelvis could be performed utilizing subtraction images to better differentiate for any subtle areas of nodular enhancement that might change management if warranted. The current study shows no overt evidence of enhancement with nodular features but with some areas of intrinsic T1 hyperintensity that make assessment difficult.   Increasing size of retroperitoneal lymph nodes, potentially reactive but suspicious for neoplasm given  other findings.   Post cholecystectomy without biliary duct distension.     Electronically Signed   By: Donzetta Kohut M.D.   On: 01/26/2021 08:32    Assessment TOA is suspected failed outpatient treatment   Plan Discussed with Dr. Alysia Penna and will admit to gyn for IV antibiotic and potential IR consult    Christy Schaefer 01/29/2021, 10:25 AM

## 2021-01-29 NOTE — Progress Notes (Signed)
Received consult for drainage of TOA.  Approved by Dr. Deanne Coffer to be done in CT.  Have made NPO tonight in anticipation of case being performed tomorrow.  Will see and consent Christy Schaefer asap tomorrow.   Sheliah Plane, PA-C Interventional Radiology

## 2021-01-29 NOTE — Progress Notes (Signed)
Patient states that starting appx 2 weeks ago severe pain, lower abdomin. States that she is having burning sensations when urinating. States she is also having back pain as well, when laying down she notices the pain in her back more. Unable to work, unable to eat, states that since starting antibiotic she has been dx with a yeast infection which she is taking flagyl for.  Patient states she is unsure of her last PAP but thinks it may be longer then 5 years, no issues or concerns with last PAP. Will contact [facility] to schedule.   Wynona Canes, CMA

## 2021-01-30 ENCOUNTER — Inpatient Hospital Stay (HOSPITAL_COMMUNITY): Payer: Self-pay

## 2021-01-30 ENCOUNTER — Encounter (HOSPITAL_COMMUNITY): Payer: Self-pay | Admitting: Family Medicine

## 2021-01-30 LAB — CERVICOVAGINAL ANCILLARY ONLY
Bacterial Vaginitis (gardnerella): NEGATIVE
Candida Glabrata: NEGATIVE
Candida Vaginitis: POSITIVE — AB
Chlamydia: NEGATIVE
Comment: NEGATIVE
Comment: NEGATIVE
Comment: NEGATIVE
Comment: NEGATIVE
Comment: NEGATIVE
Comment: NORMAL
Neisseria Gonorrhea: NEGATIVE
Trichomonas: POSITIVE — AB

## 2021-01-30 MED ORDER — MIDAZOLAM HCL 2 MG/2ML IJ SOLN
INTRAMUSCULAR | Status: AC | PRN
  Administered 2021-01-30 (×3): 1 mg via INTRAVENOUS

## 2021-01-30 MED ORDER — SODIUM CHLORIDE 0.9 % IV SOLN
INTRAVENOUS | Status: DC | PRN
Start: 2021-01-30 — End: 2021-02-05

## 2021-01-30 MED ORDER — SODIUM CHLORIDE 0.9 % IV SOLN
2.0000 g | Freq: Two times a day (BID) | INTRAVENOUS | Status: DC
  Administered 2021-01-30 – 2021-01-31 (×3): 2 g via INTRAVENOUS
  Filled 2021-01-30 (×7): qty 2

## 2021-01-30 MED ORDER — FENTANYL CITRATE (PF) 100 MCG/2ML IJ SOLN
INTRAMUSCULAR | Status: AC | PRN
  Administered 2021-01-30: 25 ug via INTRAVENOUS
  Administered 2021-01-30 (×3): 50 ug via INTRAVENOUS

## 2021-01-30 MED ORDER — FENTANYL CITRATE (PF) 100 MCG/2ML IJ SOLN
INTRAMUSCULAR | Status: AC
  Filled 2021-01-30: qty 4

## 2021-01-30 MED ORDER — LIDOCAINE HCL 1 % IJ SOLN
INTRAMUSCULAR | Status: AC
  Filled 2021-01-30: qty 10

## 2021-01-30 MED ORDER — SODIUM CHLORIDE 0.9% FLUSH
10.0000 mL | Freq: Two times a day (BID) | INTRAVENOUS | Status: DC
  Administered 2021-01-31 – 2021-02-04 (×9): 10 mL

## 2021-01-30 MED ORDER — MIDAZOLAM HCL 2 MG/2ML IJ SOLN
INTRAMUSCULAR | Status: AC
  Filled 2021-01-30: qty 6

## 2021-01-30 MED ORDER — LACTATED RINGERS IV SOLN: INTRAVENOUS | Status: DC

## 2021-01-30 NOTE — Procedures (Signed)
Vascular and Interventional Radiology Procedure Note  Patient: Christy Schaefer DOB: [birth year] Medical Record Number: [id number] Note Date/Time: 01/30/21 12:00 PM   Performing Physician: Roanna Banning, MD Assistant(s): None  Diagnosis: TOA  Procedure:  DRAINAGE CATHETER PLACEMENT(S) OF TUBO-OVARIAN ABSCESS and HYDROSALPINX  Anesthesia: Conscious Sedation Complications: None Estimated Blood Loss:  0 mL Specimens: Sent for Gram Stain, Aerobe Culture, and Anerobe Culture  Findings:  - Successful CT-guided 2 drain placement of 10 F catheters into hydrosalpinx (midline pelvis) and TOA (supraumbilical abdomen). - Thin SS fluid aspirated at hydrosalpinx. Sent for GS / Cx. - Thick pus aspirated at [facility]. Sent for GS / Cx.  Plan:  - Flush drain with 10 mL Normal Saline every 12 hours. - Follow up drain evaluation / sinogram in 2 week(s).  See detailed procedure note with images in PACS. The patient tolerated the procedure well without incident or complication and was returned to Floor Bed in stable condition.    Roanna Banning, MD [facility] Radiology   Pager. [phone] Clinic. [phone]

## 2021-01-30 NOTE — H&P (Signed)
Chief Complaint: Tubo-ovarian abscess  Referring Physician(s): Nettie Elm  Supervising Physician: Marliss Coots  Patient Status: [facility] - In-pt  History of Present Illness: Christy Schaefer is a 40 y.o. female who initially presented to [facility] in [facility] c/o pelvic pain.  Initial imaging was concerning for possible neoplasm. Patient denied fever, chills, or other signs of infection.  MRI done 01/26/21 showed- Multi cystic multiloculated pelvic process which is suspicious for TOA/PID with concomitant concern for pelvic neoplasm and or endometriosis. Hepatic lesion is either hepatic adenoma or focal fat.   IR is asked to evaluate for image guided aspiration/drain placement.  She is NPO.    History reviewed. No pertinent past medical history.  Past Surgical History:  Procedure Laterality Date   CHOLECYSTECTOMY      Allergies: Patient has no known allergies.  Medications: Prior to Admission medications   Medication Sig Start Date End Date Taking? Authorizing Provider  doxycycline (VIBRAMYCIN) 100 MG capsule Take 1 capsule (100 mg total) by mouth 2 (two) times daily for 14 days. 01/25/21 02/08/21  Loeffler, Finis Bud, PA-C  HYDROcodone-acetaminophen (NORCO/VICODIN) 5-325 MG tablet Take 2 tablets by mouth every 4 (four) hours as needed for up to 5 days. 01/25/21 01/30/21  Loeffler, Finis Bud, PA-C  metroNIDAZOLE (FLAGYL) 500 MG tablet Take 1 tablet (500 mg total) by mouth 2 (two) times daily for 14 days. 01/25/21 02/08/21  Loeffler, Finis Bud, PA-C     History reviewed. No pertinent family history.  Social History   Socioeconomic History   Marital status: Single    Spouse name: Not on file   Number of children: Not on file   Years of education: Not on file   Highest education level: Not on file  Occupational History   Not on file  Tobacco Use   Smoking status: Every Day    Packs/day: 0.75    Types: Cigarettes   Smokeless tobacco: Never  Vaping Use    Vaping Use: Never used  Substance and Sexual Activity   Alcohol use: Yes    Comment: rare   Drug use: Never   Sexual activity: Yes    Birth control/protection: None  Other Topics Concern   Not on file  Social History Narrative   Not on file   Social Determinants of Health   Financial Resource Strain: Not on file  Food Insecurity: Not on file  Transportation Needs: Not on file  Physical Activity: Not on file  Stress: Not on file  Social Connections: Not on file     Review of Systems: A 12 point ROS discussed and pertinent positives are indicated in the HPI above.  All other systems are negative.  Review of Systems  Vital Signs: BP 98/66 (BP Location: Left Arm)   Pulse 85   Temp 98.1 F (36.7 C) (Oral)   Resp 14   LMP 01/22/2021 (Approximate)   SpO2 99%   Physical Exam Vitals reviewed.  Constitutional:      Appearance: She is obese.  HENT:     Head: Normocephalic and atraumatic.  Eyes:     Extraocular Movements: Extraocular movements intact.  Cardiovascular:     Rate and Rhythm: Normal rate and regular rhythm.  Pulmonary:     Effort: Pulmonary effort is normal. No respiratory distress.     Breath sounds: Normal breath sounds.  Abdominal:     Palpations: Abdomen is soft.  Musculoskeletal:        General: Normal range of motion.  Cervical back: Normal range of motion.  Skin:    General: Skin is warm and dry.  Neurological:     General: No focal deficit present.     Mental Status: She is alert and oriented to person, place, and time.  Psychiatric:        Mood and Affect: Mood normal.        Behavior: Behavior normal.        Thought Content: Thought content normal.        Judgment: Judgment normal.    Imaging: MR PELVIS W WO CONTRAST  Result Date: 01/26/2021 CLINICAL DATA:  A 40 year old female presents with a pelvic mass and hepatic mass, knee for additional characterization. EXAM: MRI ABDOMEN AND PELVIS WITHOUT AND WITH CONTRAST TECHNIQUE:  Multiplanar multisequence MR imaging of the abdomen and pelvis was performed both before and after the administration of intravenous contrast. CONTRAST:  10mL GADAVIST GADOBUTROL 1 MMOL/ML IV SOLN COMPARISON:  Comparison is made with May 16, 2018 and with most recent CT of the abdomen and pelvis from January 25, 2021. FINDINGS: COMBINED FINDINGS FOR BOTH MR ABDOMEN AND PELVIS Lower chest: No effusion, no consolidative changes at the lung bases, limited assessment on MRI. Hepatobiliary: Subtle area of low attenuation in hepatic subsegment IV B on the CT evaluation may have subtle microscopic lipid. Overall there is no substantial iron or fat deposition in the liver and the liver displays smooth contours. No focal, suspicious lesion elsewhere in the liver. Enhancement characteristics without hyperenhancement. Area is hypoenhancing on the current study. Area likely present and little changed as far back as February 2020. Currently this measures 2.7 x 2.5 cm. Previously this measured 2.7 x 2.5 cm. Portal vein is patent. Hepatic veins are patent. Post cholecystectomy without biliary duct distension. Pancreas: Normal intrinsic T1 signal. No ductal dilation or sign of inflammation. No focal lesion. Spleen:  Normal spleen. Adrenals/Urinary Tract:  Adrenal glands are normal. Symmetric renal enhancement without hydronephrosis. Stomach/Bowel: Unremarkable to the extent evaluated on MRI not performed for bowel evaluation. Normal appendix. Rectum is closely associated with cystic and peripherally enhancing fluid-filled areas in the pelvis. No sign of bowel obstruction. Vascular/Lymphatic: Patent abdominal and pelvic vasculature. Increasing size in LEFT para-aortic lymph nodes compared to the study of February of 2010. Largest in the range of 14 mm (image 51/17) intra-aortocaval lymph nodes also with increased size and number since that time also displayed on 6 the CT evaluation acquired just prior to the MRI. Reproductive:  Multilobular and multiloculated cystic areas in the pelvis extending anterior to the uterus between uterus and urinary bladder and also into the cul-de-sac. There were some cystic changes as far back as 2020. At that time these were seen in the context of an inflammatory process and malpositioned IUD. A complex picture is associated with stranding in the rectovaginal region and stranding about the uterus and RIGHT adnexa in particular. In total RIGHT adnexal/juxta uterine area measuring approximally 12.6 x 8.6 cm greatest axial dimension and approximately 17 cm greatest craniocaudal dimension. There is an area within the midst of this multiloculated collection that measures approximately 8.2 x 7.5 x 8.3 cm and shows a low signal rim on T2. Process tracks into the LEFT adnexa as well with a similar area showing low T2 signal around the rim and in this area however there is intrinsic T1 hyperintensity without definitive signs of enhancement. This area measuring approximately 2.6 x 2.2 cm in the axial plane. Scattered areas of T1 hyperintensity along the  RIGHT superolateral margin of the dominant cystic area in the RIGHT pelvis and in the cul-de-sac to the RIGHT of the uterus. There are thick-walled areas with enhancement throughout the lesion. Other:  No ascites in the upper abdomen. Musculoskeletal: No suspicious bone lesions identified. 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 Suggest follow-up MRI utilizing Eovist which would be helpful to differentiate between these 2 entities. This could be performed on a nonemergent basis. Multi cystic multiloculated pelvic process which is suspicious for TOA/PID with concomitant concern for pelvic neoplasm and or endometriosis. There is a background of endometriosis in this patient but given the complexity of the current findings concomitant neoplasm is also given strong consideration at this time. Multiphase imaging of the pelvis could be performed  utilizing subtraction images to better differentiate for any subtle areas of nodular enhancement that might change management if warranted. The current study shows no overt evidence of enhancement with nodular features but with some areas of intrinsic T1 hyperintensity that make assessment difficult. Increasing size of retroperitoneal lymph nodes, potentially reactive but suspicious for neoplasm given other findings. Post cholecystectomy without biliary duct distension. Electronically Signed   By: Donzetta Kohut M.D.   On: 01/26/2021 08:32   MR Abdomen W or Wo Contrast  Result Date: 01/26/2021 CLINICAL DATA:  A 40 year old female presents with a pelvic mass and hepatic mass, knee for additional characterization. EXAM: MRI ABDOMEN AND PELVIS WITHOUT AND WITH CONTRAST TECHNIQUE: Multiplanar multisequence MR imaging of the abdomen and pelvis was performed both before and after the administration of intravenous contrast. CONTRAST:  61mL GADAVIST GADOBUTROL 1 MMOL/ML IV SOLN COMPARISON:  Comparison is made with May 16, 2018 and with most recent CT of the abdomen and pelvis from January 25, 2021. FINDINGS: COMBINED FINDINGS FOR BOTH MR ABDOMEN AND PELVIS Lower chest: No effusion, no consolidative changes at the lung bases, limited assessment on MRI. Hepatobiliary: Subtle area of low attenuation in hepatic subsegment IV B on the CT evaluation may have subtle microscopic lipid. Overall there is no substantial iron or fat deposition in the liver and the liver displays smooth contours. No focal, suspicious lesion elsewhere in the liver. Enhancement characteristics without hyperenhancement. Area is hypoenhancing on the current study. Area likely present and little changed as far back as February 2020. Currently this measures 2.7 x 2.5 cm. Previously this measured 2.7 x 2.5 cm. Portal vein is patent. Hepatic veins are patent. Post cholecystectomy without biliary duct distension. Pancreas: Normal intrinsic T1 signal.  No ductal dilation or sign of inflammation. No focal lesion. Spleen:  Normal spleen. Adrenals/Urinary Tract:  Adrenal glands are normal. Symmetric renal enhancement without hydronephrosis. Stomach/Bowel: Unremarkable to the extent evaluated on MRI not performed for bowel evaluation. Normal appendix. Rectum is closely associated with cystic and peripherally enhancing fluid-filled areas in the pelvis. No sign of bowel obstruction. Vascular/Lymphatic: Patent abdominal and pelvic vasculature. Increasing size in LEFT para-aortic lymph nodes compared to the study of February of 2010. Largest in the range of 14 mm (image 51/17) intra-aortocaval lymph nodes also with increased size and number since that time also displayed on 6 the CT evaluation acquired just prior to the MRI. Reproductive: Multilobular and multiloculated cystic areas in the pelvis extending anterior to the uterus between uterus and urinary bladder and also into the cul-de-sac. There were some cystic changes as far back as 2020. At that time these were seen in the context of an inflammatory process and malpositioned IUD. A complex picture is associated  with stranding in the rectovaginal region and stranding about the uterus and RIGHT adnexa in particular. In total RIGHT adnexal/juxta uterine area measuring approximally 12.6 x 8.6 cm greatest axial dimension and approximately 17 cm greatest craniocaudal dimension. There is an area within the midst of this multiloculated collection that measures approximately 8.2 x 7.5 x 8.3 cm and shows a low signal rim on T2. Process tracks into the LEFT adnexa as well with a similar area showing low T2 signal around the rim and in this area however there is intrinsic T1 hyperintensity without definitive signs of enhancement. This area measuring approximately 2.6 x 2.2 cm in the axial plane. Scattered areas of T1 hyperintensity along the RIGHT superolateral margin of the dominant cystic area in the RIGHT pelvis and in the  cul-de-sac to the RIGHT of the uterus. There are thick-walled areas with enhancement throughout the lesion. Other:  No ascites in the upper abdomen. Musculoskeletal: No suspicious bone lesions identified. 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 Suggest follow-up MRI utilizing Eovist which would be helpful to differentiate between these 2 entities. This could be performed on a nonemergent basis. Multi cystic multiloculated pelvic process which is suspicious for TOA/PID with concomitant concern for pelvic neoplasm and or endometriosis. There is a background of endometriosis in this patient but given the complexity of the current findings concomitant neoplasm is also given strong consideration at this time. Multiphase imaging of the pelvis could be performed utilizing subtraction images to better differentiate for any subtle areas of nodular enhancement that might change management if warranted. The current study shows no overt evidence of enhancement with nodular features but with some areas of intrinsic T1 hyperintensity that make assessment difficult. Increasing size of retroperitoneal lymph nodes, potentially reactive but suspicious for neoplasm given other findings. Post cholecystectomy without biliary duct distension. Electronically Signed   By: Donzetta Kohut M.D.   On: 01/26/2021 08:32   CT Abdomen Pelvis W Contrast  Result Date: 01/25/2021 CLINICAL DATA:  RIGHT LOWER QUADRANT abdominal pain. Suspected appendicitis. EXAM: CT ABDOMEN AND PELVIS WITH CONTRAST TECHNIQUE: Multidetector CT imaging of the abdomen and pelvis was performed using the standard protocol following bolus administration of intravenous contrast. CONTRAST:  OMNIPAQUE IOHEXOL 300 MG/ML  SOLN COMPARISON:  05/16/2018 FINDINGS: Lower chest: Subsegmental atelectasis at both lung bases. Hepatobiliary: Prior cholecystectomy. There is a circumscribed low-attenuation lesion in the LEFT hepatic lobe adjacent to the  falciform ligament which measures 3.5 centimeters (image 32 of series 2). Although this may represent focal fat adjacent to the falciform ligament, further evaluation is indicated. Pancreas: Unremarkable. No pancreatic ductal dilatation or surrounding inflammatory changes. Spleen: Normal in size without focal abnormality. Adrenals/Urinary Tract: Adrenal glands are normal. Kidneys are normal. Ureters are unremarkable. Bladder is decompressed and thick-walled. Stomach/Bowel: Stomach is normal in appearance. Small bowel loops are unremarkable. The appendix is well seen and normal in appearance. Colon is unremarkable. Vascular/Lymphatic: There is normal vascular opacification of the celiac axis, superior mesenteric artery, and inferior mesenteric artery. Normal appearance of the portal venous system and inferior vena cava. There are numerous enlarged retroperitoneal lymph nodes, largest in the LEFT periaortic region measuring 1.4 centimeters on image 39 of series 2. Enlarged lymph nodes identified in the RIGHT internal iliac chain, largest 1.0 centimeters on image 70 of series 2. There is mild stranding in the retroperitoneal para-aortic fat. Reproductive: Intrauterine device has been removed. There is a large complex mass within the pelvis which includes the uterus. Multiple thick-walled  confluent collections measure up to 10.2 x 6.3 x 9.1 centimeters. Normal ovarian tissue is not discrete from this mass. Measured together, these confluent low-attenuation masses and the uterus span 14.8 x 10.2 x 20.9 centimeters. The appearance is progressive compared with the prior study. Other: Possible small amount of ascites versus fluid-filled structure in the cul-de-sac. Abdominal wall is unremarkable. Musculoskeletal: No acute or significant osseous findings. IMPRESSION: 1. Large confluent pelvic mass including the uterus and numerous thick walled cystic masses which measure up to 10 centimeters in diameter. Normal ovarian  tissue is difficult to identify discrete from this confluent mass. Findings are suspicious for neoplasm. Less likely, progressive inflammatory/infectious process could have this appearance. Recommend GYN referral and possible MRI. Consider ultrasound in the acute setting. 2. 3.5 centimeter low-attenuation lesion in the LEFT hepatic lobe warranting further characterization. Recommend MRI of the abdomen with liver protocol. 3. Numerous enlarged retroperitoneal lymph nodes suspicious for metastatic disease. 4. Normal appendix. 5. Cholecystectomy. These results were called by telephone at the time of interpretation on 01/25/2021 at 2:50 pm to the ED MD, who verbally acknowledged these results. Electronically Signed   By: Norva Pavlov M.D.   On: 01/25/2021 14:51   US PELVIC COMPLETE W TRANSVAGINAL AND TORSION R/O  Result Date: 01/25/2021 CLINICAL DATA:  Pelvic pain, pelvic inflammatory disease. Abscess/mass seen on CT exam. LMP 01/10/2021 EXAM: TRANSABDOMINAL AND TRANSVAGINAL ULTRASOUND OF PELVIS TECHNIQUE: Both transabdominal and transvaginal ultrasound examinations of the pelvis were performed. Transabdominal technique was performed for global imaging of the pelvis including uterus, ovaries, adnexal regions, and pelvic cul-de-sac. It was necessary to proceed with endovaginal exam following the transabdominal exam to visualize the uterus, endometrium, ovaries and adnexal regions. COMPARISON:  CT of the abdomen and pelvis on 01/25/2021, ultrasound of the pelvis on 05/12/2018 FINDINGS: Uterus Measurements: The uterus is indistinguishable from large pelvic mass containing numerous complex cystic structures, as seen on CT exam. Endometrium Thickness: Not identified. Right ovary Measurements: Indistinguishable from large complex pelvic mass. Left ovary Measurements: Indistinguishable from large complex pelvic mass. Other findings No free pelvic fluid. IMPRESSION: Complex solid and cystic pelvic mass filling the  pelvis. Normal structures such as the uterus/endometrium and ovaries are not distinguishable from this mass. No definite free pelvic fluid. Findings are concerning for neoplasm. Although pelvic inflammatory disease could have a similar appearance, it is felt to be much less likely. Recommend further evaluation with MRI. Recommend gynecology consult. Electronically Signed   By: Norva Pavlov M.D.   On: 01/25/2021 16:17    Labs:  CBC: Recent Labs    01/25/21 1322 01/27/21 1809 01/29/21 1651  WBC 13.6* 12.5* 13.7*  HGB 11.9* 11.3* 11.6*  HCT 36.3 34.8* 35.3*  PLT 548* 393 507*    COAGS: No results for input(s): INR, APTT in the last 8760 hours.  BMP: Recent Labs    01/25/21 1322 01/27/21 1809 01/29/21 1636  NA 136 136 138  K 3.6 3.0* 3.1*  CL 102 102 102  CO2 GLUCOSE 94 101* 88  BUN CALCIUM 9.0 8.9 9.0  CREATININE 0.62 0.57 0.69  GFRNONAA >60 >60 >60    LIVER FUNCTION TESTS: Recent Labs    01/25/21 1322 01/27/21 1809 01/29/21 1636  BILITOT 0.3 0.3 0.5  AST 12* 12* 17  ALT ALKPHOS 60 55 54  PROT 9.3* 9.2* 8.9*  ALBUMIN 3.4* 3.2* 2.9*    TUMOR MARKERS: No results for input(s): AFPTM, CEA, CA199,  CHROMGRNA in the last 8760 hours.  Assessment and Plan:  Multi cystic multiloculated pelvic process which is suspicious for TOA/PID with concomitant concern for pelvic neoplasm and or endometriosis.  Images reviewed. Will proceed with image guided aspiration/drain placement today by Dr. Elby Showers.  Risks and benefits discussed with the patient including bleeding, infection, damage to adjacent structures, bowel perforation/fistula connection, and sepsis.  All of the patient's questions were answered, patient is agreeable to proceed. Consent signed and in chart.  Thank you for allowing our service to participate in Teodora Baumgarten 's care.  Electronically Signed: Gwynneth Macleod, PA-C   01/30/2021, 8:48 AM      I spent a total of 40  Minutes in face to face in clinical consultation, greater than 50% of which was counseling/coordinating care for pelvic abscess drain.

## 2021-01-31 LAB — CBC WITH DIFFERENTIAL/PLATELET
Abs Immature Granulocytes: 0.06 10*3/uL (ref 0.00–0.07)
Basophils Absolute: 0.1 10*3/uL (ref 0.0–0.1)
Basophils Relative: 0 %
Eosinophils Absolute: 0.2 10*3/uL (ref 0.0–0.5)
Eosinophils Relative: 2 %
HCT: 33.7 % — ABNORMAL LOW (ref 36.0–46.0)
Hemoglobin: 10.7 g/dL — ABNORMAL LOW (ref 12.0–15.0)
Immature Granulocytes: 0 %
Lymphocytes Relative: 17 %
Lymphs Abs: 2.5 10*3/uL (ref 0.7–4.0)
MCH: 28.8 pg (ref 26.0–34.0)
MCHC: 31.8 g/dL (ref 30.0–36.0)
MCV: 90.8 fL (ref 80.0–100.0)
Monocytes Absolute: 1.1 10*3/uL — ABNORMAL HIGH (ref 0.1–1.0)
Monocytes Relative: 8 %
Neutro Abs: 10.6 10*3/uL — ABNORMAL HIGH (ref 1.7–7.7)
Neutrophils Relative %: 73 %
Platelets: 517 10*3/uL — ABNORMAL HIGH (ref 150–400)
RBC: 3.71 MIL/uL — ABNORMAL LOW (ref 3.87–5.11)
RDW: 12.7 % (ref 11.5–15.5)
WBC: 14.5 10*3/uL — ABNORMAL HIGH (ref 4.0–10.5)
nRBC: 0 % (ref 0.0–0.2)

## 2021-01-31 MED ORDER — FLUCONAZOLE 150 MG PO TABS
150.0000 mg | ORAL_TABLET | Freq: Once | ORAL | Status: AC
  Administered 2021-01-31: 150 mg via ORAL
  Filled 2021-01-31 (×2): qty 1

## 2021-01-31 NOTE — Progress Notes (Signed)
1 Day Post-Op Procedure(s) (LRB): DRAINAGE CATHETER PLACEMENT(S) OF TUBO-OVARIAN ABSCESS and HYDROSALPINX  Subjective: Patient reports incisional pain, tolerating PO, and no problems voiding.  Ambulating without difficulty. Reports having symptoms of a vaginal yeast infection.  Objective: I have reviewed patient's vital signs, intake and output, medications, and labs. Patient Vitals for the past 24 hrs:  BP Temp Temp src Pulse Resp SpO2  01/31/21 0551 107/70 98.5 F (36.9 C) Oral 93 16 98 %  01/30/21 2300 111/82 98.8 F (37.1 C) Oral 98 20 100 %  01/30/21 1735 131/78 98.6 F (37 C) Oral (!) 106 20 100 %  01/30/21 1542 (!) 133/92 98.5 F (36.9 C) Oral 97 18 100 %  01/30/21 1310 127/77 98.3 F (36.8 C) Oral (!) 105 17 100 %  01/30/21 1135 121/86 -- -- 69 12 100 %  01/30/21 1130 133/84 -- -- 72 12 99 %  01/30/21 1125 113/81 -- -- 69 13 100 %  01/30/21 1120 (!) 124/96 -- -- 73 13 100 %  01/30/21 1115 121/88 -- -- 83 13 98 %  01/30/21 1110 125/90 -- -- 76 12 100 %  01/30/21 1105 116/87 -- -- 76 14 97 %  01/30/21 1100 120/90 -- -- 81 14 96 %  01/30/21 1055 115/89 -- -- 82 15 98 %  01/30/21 1050 114/81 -- -- 79 12 98 %  01/30/21 1045 134/89 -- -- 77 14 96 %  01/30/21 1040 (!) 121/97 -- -- 65 12 98 %  01/30/21 1035 (!) 127/94 -- -- 82 12 97 %  01/30/21 0950 (!) 123/95 -- -- 60 15 100 %   I/O last 3 completed shifts: In: 260 [P.O.:240; Other:20] Out: 150 [Drains:150]  General: alert and no distress Resp: normal breath sounds Cardio: regular rate and rhythm GI: soft, mild right sided tenderness, drains in place Pelvic: deferred Extremities: extremities normal, atraumatic, no cyanosis or edema and Homans sign is negative, no sign of DVT  Results for orders placed or performed during the hospital encounter of 01/29/21 (from the past 24 hour(s))  Aerobic/Anaerobic Culture w Gram Stain (surgical/deep wound)     Status: None (Preliminary result)   Collection Time: 01/30/21 12:00 PM    Specimen: Abscess  Result Value Ref Range   Specimen Description ABSCESS OVARY FALLOPIAN TUBE    Special Requests SPEC 1    Gram Stain      FEW WBC PRESENT,BOTH PMN AND MONONUCLEAR NO ORGANISMS SEEN Performed at [facility], [address] [place], [place] [address]    Culture PENDING    Report Status PENDING     Assessment: s/p IR drainage of TOA on 01/30/21,  stable  Plan: Will continue Cefotetan and Doxycycline for at least 48-72 hours, can switch to oral antibiotics in morning if remains stable Analgesia as needed Diflucan prescribed for likely yeast infection Diet as tolerated If patient remains stable, may plan for discharge tomorrow   LOS: 2 days    Jaynie Collins, MD 01/31/2021, 7:42 AM

## 2021-01-31 NOTE — Progress Notes (Signed)
Dressing change, insertion site clean and dry. No drainage noted to old dressing

## 2021-01-31 NOTE — Progress Notes (Signed)
0 Day Post-Op Procedure(s) (LRB): DRAINAGE CATHETER PLACEMENT(S) OF TUBO-OVARIAN ABSCESS and HYDROSALPINX  Subjective: Patient reported mild pain and site of drainage.  Tolerating oral intake.  Has been OOB.   Objective: I have reviewed patient's vital signs, intake and output, medications, and labs.  Patient Vitals   BP Temp Temp src Pulse Resp SpO2  01/30/21 1735 131/78 98.6 F (37 C) Oral (!) 106 20 100 %  01/30/21 1542 (!) 133/92 98.5 F (36.9 C) Oral 97 18 100 %  01/30/21 1310 127/77 98.3 F (36.8 C) Oral (!) 105 17 100 %  01/30/21 1135 121/86 -- -- 69 12 100 %  01/30/21 1130 133/84 -- -- 72 12 99 %  01/30/21 1125 113/81 -- -- 69 13 100 %  01/30/21 1120 (!) 124/96 -- -- 73 13 100 %  01/30/21 1115 121/88 -- -- 83 13 98 %  01/30/21 1110 125/90 -- -- 76 12 100 %  01/30/21 1105 116/87 -- -- 76 14 97 %  01/30/21 1100 120/90 -- -- 81 14 96 %  01/30/21 1055 115/89 -- -- 82 15 98 %  01/30/21 1050 114/81 -- -- 79 12 98 %  01/30/21 1045 134/89 -- -- 77 14 96 %  01/30/21 1040 (!) 121/97 -- -- 65 12 98 %  01/30/21 1035 (!) 127/94 -- -- 82 12 97 %  01/30/21 0950 (!) 123/95 -- -- 60 15 100 %   General: alert and no distress Resp: normal breath sounds Cardio: regular rate and rhythm GI: soft, moderate right sided tenderness, drains in place Extremities: extremities normal, atraumatic, no cyanosis or edema and Homans sign is negative, no sign of DVT  CBC Latest Ref Rng & Units 01/29/2021 01/27/2021 01/25/2021  WBC 4.0 - 10.5 K/uL 13.7(H) 12.5(H) 13.6(H)  Hemoglobin 12.0 - 15.0 g/dL 11.6(L) 11.3(L) 11.9(L)  Hematocrit 36.0 - 46.0 % 35.3(L) 34.8(L) 36.3  Platelets 150 - 400 K/uL 507(H) 393 548(H)    Assessment/Plan: Appreciate IR placement of drains Will continue Cefotetan and Doxycycline, analgesia as needed If patient remains stable, plan for discharge around 02/01/21   Jaynie Collins, MD

## 2021-01-31 NOTE — Progress Notes (Deleted)
Both drains flushed with 17ml NS each. Patient tolerated well

## 2021-01-31 NOTE — Progress Notes (Signed)
Referring Physician(s): * No referring provider recorded for this case *  Supervising Physician: Mir, Mauri Reading  Patient Status:  [facility] - In-pt  Chief Complaint:  1 day status post drainage of TOA and hdrosalpinx  Subjective:  Patient sitting on side of bed talking and in no apparent distress.  Reports feeling much better and pain improving.  RN reports drains functioning well.    Allergies: Patient has no known allergies.  Medications: Prior to Admission medications   Medication Sig Start Date End Date Taking? Authorizing Provider  doxycycline (VIBRAMYCIN) 100 MG capsule Take 1 capsule (100 mg total) by mouth 2 (two) times daily for 14 days. 01/25/21 02/08/21  Loeffler, Finis Bud, PA-C  metroNIDAZOLE (FLAGYL) 500 MG tablet Take 1 tablet (500 mg total) by mouth 2 (two) times daily for 14 days. 01/25/21 02/08/21  Loeffler, Finis Bud, PA-C     Vital Signs: BP 131/81 (BP Location: Right Arm)   Pulse 95   Temp 98.4 F (36.9 C) (Oral)   Resp 16   LMP 01/22/2021 (Approximate)   SpO2 95%   Drain Location: periumbilical and suprapubic Size: 10.2 Fr Date of placement: 01/30/21  Currently to: Drain collection device: gravity 24 hour output:  Output by Drain (mL) 01/29/21 0700 - 01/29/21 1459 01/29/21 1500 - 01/29/21 2259 01/29/21 2300 - 01/30/21 0659 01/30/21 0700 - 01/30/21 1459 01/30/21 1500 - 01/30/21 2259 01/30/21 2300 - 01/31/21 0659 01/31/21 0700 - 01/31/21 1448  Closed System Drain 1 Midline;Left;Proximal LUQ 10 Fr.      0 60  Closed System Drain 2 Left;Distal LLQ 10 Fr.      150 20    Interval imaging/drain manipulation:  none  Current examination: Flushes/aspirates easily.  Insertion site unremarkable. Suture and stat lock in place. Dressed appropriately.   Periumbilical drain with thick purulent drainage Suprapubic drain with serous output   Imaging: CT IMAGE GUIDED DRAINAGE BY PERCUTANEOUS CATHETER  Result Date: 01/30/2021 INDICATION: Tubo-ovarian  abscess EXAM: CT IMAGE GUIDED DRAINAGE BY PERCUTANEOUS CATHETER PLACEMENT X2 COMPARISON:  CT abdomen and pelvis, 01/25/2021. MR abdomen and pelvis, 01/25/2021. MEDICATIONS: The patient is currently admitted to the hospital and receiving intravenous antibiotics. The antibiotics were administered within an appropriate time frame prior to the initiation of the procedure. ANESTHESIA/SEDATION: Moderate (conscious) sedation was employed during this procedure. A total of Versed 3 mg and Fentanyl 175 mcg was administered intravenously. Moderate Sedation Time: 61 minutes. The patient's level of consciousness and vital signs were monitored continuously by radiology nursing throughout the procedure under my direct supervision. CONTRAST:  None COMPLICATIONS: None immediate. PROCEDURE: Informed written consent was obtained from the patient after a discussion of the risks, benefits and alternatives to treatment. The patient was placed supine on the CT gantry and a pre procedural CT was performed re-demonstrating the known abscess/fluid collections within the pelvis. The procedure was planned. A timeout was performed prior to the initiation of the procedure. The anterior abdomen and pelvis were prepped and draped in the usual sterile fashion. At the midline pelvis, the overlying soft tissues were anesthetized with 1% lidocaine with epinephrine. Appropriate trajectory was planned with the use of a 22 gauge spinal needle. An 18 gauge trocar needle was advanced into the abscess/fluid collection and a short Amplatz super stiff wire was coiled within the collection. Appropriate positioning was confirmed with a limited CT scan. The tract was serially dilated allowing placement of a 10 [place] all-purpose drainage catheter. The process was repeated at the midline supraumbilical abdomen, the  overlying soft tissues were anesthetized with 1% lidocaine with epinephrine. Appropriate trajectory was planned with the use of a 22 gauge spinal  needle. An 18 gauge trocar needle was advanced into the abscess/fluid collection and a short Amplatz super stiff wire was coiled within the collection. Appropriate positioning was confirmed with a limited CT scan. The tract was serially dilated allowing placement of another 10 [place] all-purpose drainage catheter. Appropriate positioning of the drains was confirmed with a limited postprocedural CT scan. Thin serous fluid and purulent fluid were aspirated at the midline pelvis and supraumbilical collections, respectively. The tubes were connected to drainage bags and sutured in place. A dressing was placed. The patient tolerated the procedure well without immediate post procedural complication. IMPRESSION: 1. Successful CT guided placement of 10 Fr drain catheters into the midline pelvis and supraumbilical abdomen for tubo-ovarian abscess, as above. 2. Purulent fluid was aspirated at the supraumbilical abdomen. Samples were sent to the laboratory as requested by the ordering clinical team. Roanna Banning, MD [facility] Radiology Electronically Signed   By: Roanna Banning M.D.   On: 01/30/2021 17:09   CT IMAGE GUIDED DRAINAGE BY PERCUTANEOUS CATHETER  Result Date: 01/30/2021 INDICATION: Tubo-ovarian abscess EXAM: CT IMAGE GUIDED DRAINAGE BY PERCUTANEOUS CATHETER PLACEMENT X2 COMPARISON:  CT abdomen and pelvis, 01/25/2021. MR abdomen and pelvis, 01/25/2021. MEDICATIONS: The patient is currently admitted to the hospital and receiving intravenous antibiotics. The antibiotics were administered within an appropriate time frame prior to the initiation of the procedure. ANESTHESIA/SEDATION: Moderate (conscious) sedation was employed during this procedure. A total of Versed 3 mg and Fentanyl 175 mcg was administered intravenously. Moderate Sedation Time: 61 minutes. The patient's level of consciousness and vital signs were monitored continuously by radiology nursing throughout  the procedure under my direct supervision. CONTRAST:  None COMPLICATIONS: None immediate. PROCEDURE: Informed written consent was obtained from the patient after a discussion of the risks, benefits and alternatives to treatment. The patient was placed supine on the CT gantry and a pre procedural CT was performed re-demonstrating the known abscess/fluid collections within the pelvis. The procedure was planned. A timeout was performed prior to the initiation of the procedure. The anterior abdomen and pelvis were prepped and draped in the usual sterile fashion. At the midline pelvis, the overlying soft tissues were anesthetized with 1% lidocaine with epinephrine. Appropriate trajectory was planned with the use of a 22 gauge spinal needle. An 18 gauge trocar needle was advanced into the abscess/fluid collection and a short Amplatz super stiff wire was coiled within the collection. Appropriate positioning was confirmed with a limited CT scan. The tract was serially dilated allowing placement of a 10 [place] all-purpose drainage catheter. The process was repeated at the midline supraumbilical abdomen, the overlying soft tissues were anesthetized with 1% lidocaine with epinephrine. Appropriate trajectory was planned with the use of a 22 gauge spinal needle. An 18 gauge trocar needle was advanced into the abscess/fluid collection and a short Amplatz super stiff wire was coiled within the collection. Appropriate positioning was confirmed with a limited CT scan. The tract was serially dilated allowing placement of another 10 [place] all-purpose drainage catheter. Appropriate positioning of the drains was confirmed with a limited postprocedural CT scan. Thin serous fluid and purulent fluid were aspirated at the midline pelvis and supraumbilical collections, respectively. The tubes were connected to drainage bags and sutured in place. A dressing was placed. The patient tolerated the procedure well without immediate post  procedural complication. IMPRESSION: 1.  Successful CT guided placement of 10 Fr drain catheters into the midline pelvis and supraumbilical abdomen for tubo-ovarian abscess, as above. 2. Purulent fluid was aspirated at the supraumbilical abdomen. Samples were sent to the laboratory as requested by the ordering clinical team. Roanna Banning, MD [facility] Radiology Electronically Signed   By: Roanna Banning M.D.   On: 01/30/2021 17:09    Labs:  CBC: Recent Labs    01/25/21 1322 01/27/21 1809 01/29/21 1651 01/31/21 0708  WBC 13.6* 12.5* 13.7* 14.5*  HGB 11.9* 11.3* 11.6* 10.7*  HCT 36.3 34.8* 35.3* 33.7*  PLT 548* 393 507* 517*    BMP: Recent Labs    01/25/21 1322 01/27/21 1809 01/29/21 1636  NA 136 136 138  K 3.6 3.0* 3.1*  CL 102 102 102  CO2 26 25 25   GLUCOSE 94 101* 88  BUN 8 7 6   CALCIUM 9.0 8.9 9.0  CREATININE 0.62 0.57 0.69  GFRNONAA >60 >60 >60    LIVER FUNCTION TESTS: Recent Labs    01/25/21 1322 01/27/21 1809 01/29/21 1636  BILITOT 0.3 0.3 0.5  AST 12* 12* 17  ALT 14 12 15   ALKPHOS 60 55 54  PROT 9.3* 9.2* 8.9*  ALBUMIN 3.4* 3.2* 2.9*    Assessment and Plan: 1 day status post CT drainage of TOA and hydrosalpinx --on ABX --pt voicing feeling better --draining well --WBC up to 14.5 --Continue TID flushes with 5 cc NS. --Record output Q shift. --Dressing changes QD or PRN if soiled.  --Call IR APP or on call IR MD if difficulty flushing or sudden change in drain output.  --Repeat imaging/possible drain injection once output < 10 mL/QD (excluding flush material.)  Discharge planning: Please contact IR APP or on call IR MD prior to patient d/c to ensure appropriate follow up plans are in place. Typically patient will follow up with IR clinic 10-14 days post d/c for repeat imaging/possible drain injection. IR scheduler will contact patient with date/time of appointment. Patient will need to flush drain QD with  5 cc NS, record output QD, dressing changes every 2-3 days or earlier if soiled.   IR will continue to follow - please call with questions or concerns.  Electronically Signed: 01/29/21, PA 01/31/2021, 2:45 PM   I spent a total of 25 Minutes at the the patient's bedside AND on the patient's hospital floor or unit, greater than 50% of which was counseling/coordinating care for abscess drain.

## 2021-02-01 MED ORDER — DOXYCYCLINE HYCLATE 100 MG PO TABS
100.0000 mg | ORAL_TABLET | Freq: Two times a day (BID) | ORAL | Status: DC
  Administered 2021-02-01 – 2021-02-04 (×7): 100 mg via ORAL
  Filled 2021-02-01 (×7): qty 1

## 2021-02-01 MED ORDER — AMOXICILLIN-POT CLAVULANATE 875-125 MG PO TABS
1.0000 | ORAL_TABLET | Freq: Two times a day (BID) | ORAL | Status: DC
  Administered 2021-02-01 – 2021-02-02 (×3): 1 via ORAL
  Filled 2021-02-01 (×3): qty 1

## 2021-02-01 MED ORDER — KETOROLAC TROMETHAMINE 10 MG PO TABS
10.0000 mg | ORAL_TABLET | Freq: Three times a day (TID) | ORAL | Status: AC | PRN
Start: 2021-02-01 — End: 2021-02-02
  Administered 2021-02-01: 10 mg via ORAL
  Filled 2021-02-01 (×2): qty 1

## 2021-02-01 NOTE — Progress Notes (Signed)
Referring Physician(s): Dr Debroah Loop  Supervising Physician: Pernell Dupre  Patient Status:  [facility] - In-pt  Chief Complaint:  Abdominal abscess Post IR procedure 10/27: Findings:  - Successful CT-guided 2 drain placement of 10 F catheters into hydrosalpinx (midline pelvis) and TOA (supraumbilical abdomen). - Thin SS fluid aspirated at hydrosalpinx. Sent for GS / Cx. - Thick pus aspirated at [facility]. Sent for GS / Cx  Subjective:  Draining well - both drains Pt is feeling better Both drains flush easily Both NGTD    Allergies: Patient has no known allergies.  Medications: Prior to Admission medications   Medication Sig Start Date End Date Taking? Authorizing Provider  doxycycline (VIBRAMYCIN) 100 MG capsule Take 1 capsule (100 mg total) by mouth 2 (two) times daily for 14 days. 01/25/21 02/08/21  Loeffler, Finis Bud, PA-C  metroNIDAZOLE (FLAGYL) 500 MG tablet Take 1 tablet (500 mg total) by mouth 2 (two) times daily for 14 days. 01/25/21 02/08/21  Loeffler, Finis Bud, PA-C     Vital Signs: BP 121/76 (BP Location: Right Arm)   Pulse (!) 107   Temp 98.3 F (36.8 C) (Oral)   Resp 18   LMP 01/22/2021 (Approximate)   SpO2 95%   Physical Exam Vitals reviewed.  Skin:    General: Skin is warm.     Comments: Sites are clean and dry NT no bleeding No sign of infection Both drains flush easily OP upper drain is thick yellow fluid--- 30 cc in bag OP lower drain is clear yellow fluid-- minimal OP in bag  NGTD for either  Neurological:     Mental Status: She is alert.    Imaging: CT IMAGE GUIDED DRAINAGE BY PERCUTANEOUS CATHETER  Result Date: 01/30/2021 INDICATION: Tubo-ovarian abscess EXAM: CT IMAGE GUIDED DRAINAGE BY PERCUTANEOUS CATHETER PLACEMENT X2 COMPARISON:  CT abdomen and pelvis, 01/25/2021. MR abdomen and pelvis, 01/25/2021. MEDICATIONS: The patient is currently admitted to the hospital and receiving intravenous antibiotics. The antibiotics were administered  within an appropriate time frame prior to the initiation of the procedure. ANESTHESIA/SEDATION: Moderate (conscious) sedation was employed during this procedure. A total of Versed 3 mg and Fentanyl 175 mcg was administered intravenously. Moderate Sedation Time: 61 minutes. The patient's level of consciousness and vital signs were monitored continuously by radiology nursing throughout the procedure under my direct supervision. CONTRAST:  None COMPLICATIONS: None immediate. PROCEDURE: Informed written consent was obtained from the patient after a discussion of the risks, benefits and alternatives to treatment. The patient was placed supine on the CT gantry and a pre procedural CT was performed re-demonstrating the known abscess/fluid collections within the pelvis. The procedure was planned. A timeout was performed prior to the initiation of the procedure. The anterior abdomen and pelvis were prepped and draped in the usual sterile fashion. At the midline pelvis, the overlying soft tissues were anesthetized with 1% lidocaine with epinephrine. Appropriate trajectory was planned with the use of a 22 gauge spinal needle. An 18 gauge trocar needle was advanced into the abscess/fluid collection and a short Amplatz super stiff wire was coiled within the collection. Appropriate positioning was confirmed with a limited CT scan. The tract was serially dilated allowing placement of a 10 [place] all-purpose drainage catheter. The process was repeated at the midline supraumbilical abdomen, the overlying soft tissues were anesthetized with 1% lidocaine with epinephrine. Appropriate trajectory was planned with the use of a 22 gauge spinal needle. An 18 gauge trocar needle was advanced into the abscess/fluid collection and a  short Amplatz super stiff wire was coiled within the collection. Appropriate positioning was confirmed with a limited CT scan. The tract was serially dilated allowing placement of another 10 [place] all-purpose  drainage catheter. Appropriate positioning of the drains was confirmed with a limited postprocedural CT scan. Thin serous fluid and purulent fluid were aspirated at the midline pelvis and supraumbilical collections, respectively. The tubes were connected to drainage bags and sutured in place. A dressing was placed. The patient tolerated the procedure well without immediate post procedural complication. IMPRESSION: 1. Successful CT guided placement of 10 Fr drain catheters into the midline pelvis and supraumbilical abdomen for tubo-ovarian abscess, as above. 2. Purulent fluid was aspirated at the supraumbilical abdomen. Samples were sent to the laboratory as requested by the ordering clinical team. Roanna Banning, MD [facility] Radiology Electronically Signed   By: Roanna Banning M.D.   On: 01/30/2021 17:09   CT IMAGE GUIDED DRAINAGE BY PERCUTANEOUS CATHETER  Result Date: 01/30/2021 INDICATION: Tubo-ovarian abscess EXAM: CT IMAGE GUIDED DRAINAGE BY PERCUTANEOUS CATHETER PLACEMENT X2 COMPARISON:  CT abdomen and pelvis, 01/25/2021. MR abdomen and pelvis, 01/25/2021. MEDICATIONS: The patient is currently admitted to the hospital and receiving intravenous antibiotics. The antibiotics were administered within an appropriate time frame prior to the initiation of the procedure. ANESTHESIA/SEDATION: Moderate (conscious) sedation was employed during this procedure. A total of Versed 3 mg and Fentanyl 175 mcg was administered intravenously. Moderate Sedation Time: 61 minutes. The patient's level of consciousness and vital signs were monitored continuously by radiology nursing throughout the procedure under my direct supervision. CONTRAST:  None COMPLICATIONS: None immediate. PROCEDURE: Informed written consent was obtained from the patient after a discussion of the risks, benefits and alternatives to treatment. The patient was placed supine on the CT gantry and a pre procedural  CT was performed re-demonstrating the known abscess/fluid collections within the pelvis. The procedure was planned. A timeout was performed prior to the initiation of the procedure. The anterior abdomen and pelvis were prepped and draped in the usual sterile fashion. At the midline pelvis, the overlying soft tissues were anesthetized with 1% lidocaine with epinephrine. Appropriate trajectory was planned with the use of a 22 gauge spinal needle. An 18 gauge trocar needle was advanced into the abscess/fluid collection and a short Amplatz super stiff wire was coiled within the collection. Appropriate positioning was confirmed with a limited CT scan. The tract was serially dilated allowing placement of a 10 [place] all-purpose drainage catheter. The process was repeated at the midline supraumbilical abdomen, the overlying soft tissues were anesthetized with 1% lidocaine with epinephrine. Appropriate trajectory was planned with the use of a 22 gauge spinal needle. An 18 gauge trocar needle was advanced into the abscess/fluid collection and a short Amplatz super stiff wire was coiled within the collection. Appropriate positioning was confirmed with a limited CT scan. The tract was serially dilated allowing placement of another 10 [place] all-purpose drainage catheter. Appropriate positioning of the drains was confirmed with a limited postprocedural CT scan. Thin serous fluid and purulent fluid were aspirated at the midline pelvis and supraumbilical collections, respectively. The tubes were connected to drainage bags and sutured in place. A dressing was placed. The patient tolerated the procedure well without immediate post procedural complication. IMPRESSION: 1. Successful CT guided placement of 10 Fr drain catheters into the midline pelvis and supraumbilical abdomen for tubo-ovarian abscess, as above. 2. Purulent fluid was aspirated at the supraumbilical abdomen. Samples were sent to the laboratory as  requested by the  ordering clinical team. Roanna Banning, MD [facility] Radiology Electronically Signed   By: Roanna Banning M.D.   On: 01/30/2021 17:09    Labs:  CBC: Recent Labs    01/25/21 1322 01/27/21 1809 01/29/21 1651 01/31/21 0708  WBC 13.6* 12.5* 13.7* 14.5*  HGB 11.9* 11.3* 11.6* 10.7*  HCT 36.3 34.8* 35.3* 33.7*  PLT 548* 393 507* 517*    COAGS: No results for input(s): INR, APTT in the last 8760 hours.  BMP: Recent Labs    01/25/21 1322 01/27/21 1809 01/29/21 1636  NA 136 136 138  K 3.6 3.0* 3.1*  CL 102 102 102  CO2 26 25 25   GLUCOSE 94 101* 88  BUN 8 7 6   CALCIUM 9.0 8.9 9.0  CREATININE 0.62 0.57 0.69  GFRNONAA >60 >60 >60    LIVER FUNCTION TESTS: Recent Labs    01/25/21 1322 01/27/21 1809 01/29/21 1636  BILITOT 0.3 0.3 0.5  AST 12* 12* 17  ALT 14 12 15   ALKPHOS 60 55 54  PROT 9.3* 9.2* 8.9*  ALBUMIN 3.4* 3.2* 2.9*    Assessment and Plan:  TOA Abd abscess drain x 2 IR to call pt with time and date of OP follow up (orders in place) Will need Rx sterile saline flushes upon DC Flush ea drain 5-10 cc daily; record OP daily She will hear from IR scheduler   Electronically Signed: 01/31/21, PA-C 02/01/2021, 1:27 PM   I spent a total of 15 Minutes at the the patient's bedside AND on the patient's hospital floor or unit, greater than 50% of which was counseling/coordinating care for abscess drains

## 2021-02-01 NOTE — Progress Notes (Signed)
2 Day Post-Op Procedure(s) (LRB): DRAINAGE CATHETER PLACEMENT(S) OF TUBO-OVARIAN ABSCESS and HYDROSALPINX  Subjective: Patient reports incisional pain, tolerating PO, and no problems voiding.  Objective: I have reviewed patient's vital signs, intake and output, medications, labs, and microbiology. Blood pressure (!) 125/91, pulse (!) 111, temperature 98.7 F (37.1 C), temperature source Oral, resp. rate 18, last menstrual period 01/22/2021, SpO2 98 %.  General: alert, cooperative, and no distress Resp: normal effort GI: soft, non-tender; bowel sounds normal; no masses,  no organomegaly and incision: clean, dry, intact, and 2 drains in place Extremities: extremities normal, atraumatic, no cyanosis or edema  Intake/Output Summary (Last 24 hours) at 02/01/2021 0959 Last data filed at 02/01/2021 0981 Gross per 24 hour  Intake 3245.08 ml  Output 571 ml  Net 2674.08 ml  Serous drainage in one bag and copious pus in second bag  Assessment: s/p IR drainage of TOA on 01/30/21,  stable Change to PO Abx and observe Plan: Encourage ambulation PO pain meds  LOS: 3 days    Scheryl Darter 02/01/2021, 9:56 AM

## 2021-02-01 NOTE — Discharge Summary (Signed)
Physician Discharge Summary  Patient ID: Christy Schaefer MRN: [id number] DOB/AGE: April 10, 1980 40 y.o.  Admit date: 01/29/2021 Discharge date: 02/05/2021  Admission Diagnoses: TOA  Discharge Diagnoses:  Principal Problem:   TOA (tubo-ovarian abscess) Active Problems:   Trichomoniasis   BMI 40.0-44.9, adult ([place])   Hypertension   Abdominal fluid collection   Abscess   Intra-abdominal abscess [facility])   Discharged Condition: stable  Hospital Course: 40yo female admitted for failed outpatient management of bilateral TOA.  She was admitted for IV antibiotics, pt seen by IR with placement of 2 drains on 10/27.  Initially, pt had been improving; however on HD #5, POD#3, pt was noted to be febrile.  Repeat imaging was completed on 10/30, which showed a recollection of a mass, IR placed an additional 2 drains.  ID was consulted, antibiotics were transitioned from Cefotetan/Doxy, Augmentin to Unasyn/Doxy then to Rocephin and Flagyl.  Plan to continue IV antibiotics for 1 mo of treatment.  Case management involved to confirm Home Health for PICC line management/IV antibiotics.  Plan for 8-10 day follow up with IR regarding drain management.  Consults: ID and interventional radiology  Significant Diagnostic Studies: labs:  CBC Latest Ref Rng & Units 02/05/2021 02/04/2021 02/03/2021  WBC 4.0 - 10.5 K/uL 9.8 9.1 11.8(H)  Hemoglobin 12.0 - 15.0 g/dL 10.3(L) 10.6(L) 11.1(L)  Hematocrit 36.0 - 46.0 % 32.0(L) 32.6(L) 33.9(L)  Platelets 150 - 400 K/uL 419(H) 472(H) 333   , microbiology: blood culture: negative and wound culture: no organism 10/30- Abdominal CT Reproductive: Redemonstration of a complex multi-cystic pelvic mass measuring up to least 15.8 x 8.5 x 10.8 cm. Interval decrease in size of the superior most largest in inferior-most largest component. The lesion is noted to be inseparable from the fundus of the uterus. The bilateral ovaries are not definitely identified.  Treatments: IV  hydration, antibiotics: Rocephin/Flagyl, analgesia: Dilaudid then toradol, oxycodone, and procedures: PICC line and IR drain placement  Discharge Exam: Blood pressure 127/80, pulse 84, temperature 98 F (36.7 C), temperature source Oral, resp. rate 18, height '5\' 4"'  (1.626 m), weight 105.7 kg, last menstrual period 01/22/2021, SpO2 98 %. General appearance: alert, cooperative, and no distress Resp: clear to auscultation bilaterally Cardio: regular rate and rhythm GI: soft and appropriately tender, no rebound no guarding. Dressings placed over drains- clean and dry Extremities: edema absent Skin: warm and dry  Disposition:   Discharge Instructions     [facility] pharmacist to adjust dose for Vancomycin, Aminoglycosides and other anti-infective therapies as requested by physician.   Complete by: As directed    [facility] to provide Cath Flo 36m   Complete by: As directed    Administer for PICC line occlusion and as ordered by physician for other access device issues.   Anaphylaxis Kit: Provided to treat any anaphylactic reaction to the medication being provided to the patient if First Dose or when requested by physician   Complete by: As directed    Epinephrine 179mml vial / amp: Administer 0.41m32m0.41ml31mubcutaneously once for moderate to severe anaphylaxis, nurse to call physician and pharmacy when reaction occurs and call 911 if needed for immediate care   Diphenhydramine 50mg91mIV vial: Administer 25-50mg 66mM PRN for first dose reaction, rash, itching, mild reaction, nurse to call physician and pharmacy when reaction occurs   Sodium Chloride 0.9% NS 500ml I27mdminister if needed for hypovolemic blood pressure drop or as ordered by physician after call to physician with anaphylactic reaction   Change  dressing on IV access line weekly and PRN   Complete by: As directed    Flush IV access with Sodium Chloride 0.9% and Heparin 10 units/ml or 100 units/ml    Complete by: As directed    Home infusion instructions - [facility]   Complete by: As directed    Instructions: Flush IV access with Sodium Chloride 0.9% and Heparin 10units/ml or 100units/ml   Change dressing on IV access line: Weekly and PRN   Instructions Cath Flo 72m: Administer for PICC Line occlusion and as ordered by physician for other access device   [facility] pharmacist to adjust dose for: Vancomycin, Aminoglycosides and other anti-infective therapies as requested by physician   Method of administration may be changed at the discretion of home infusion pharmacist based upon assessment of the patient and/or caregiver's ability to self-administer the medication ordered   Complete by: As directed       Allergies as of 02/05/2021   No Known Allergies      Medication List     STOP taking these medications    doxycycline 100 MG capsule Commonly known as: VIBRAMYCIN   HYDROcodone-acetaminophen 5-325 MG tablet Commonly known as: NORCO/VICODIN       TAKE these medications    acetaminophen 325 MG tablet Commonly known as: TYLENOL Take 2 tablets (650 mg total) by mouth every 4 (four) hours as needed for fever.   cefTRIAXone  IVPB Commonly known as: ROCEPHIN Inject 2 g into the vein daily for 27 days. Indication:  Tubo-ovarian abscess First Dose: Yes Last Day of Therapy:  03/03/2021 Labs - Once weekly:  CBC/D and BMP, Labs - Every other week:  ESR and CRP Method of administration: IV Push Method of administration may be changed at the discretion of home infusion pharmacist based upon assessment of the patient and/or caregiver's ability to self-administer the medication ordered.   docusate sodium 100 MG capsule Commonly known as: Colace Take 1 capsule (100 mg total) by mouth 2 (two) times daily.   ibuprofen 600 MG tablet Commonly known as: ADVIL Take 1 tablet (600 mg total) by mouth every 6 (six) hours as needed.   metroNIDAZOLE 500 MG  tablet Commonly known as: FLAGYL Take 1 tablet (500 mg total) by mouth 3 (three) times daily for 27 days. Stop date 03/03/2021 What changed:  when to take this additional instructions   oxyCODONE 5 MG immediate release tablet Commonly known as: Oxy IR/ROXICODONE Take 1-2 tablets (5-10 mg total) by mouth every 6 (six) hours as needed for up to 5 days for severe pain.               Discharge Care Instructions  (From admission, onward)           Start     Ordered   02/05/21 0000  Change dressing on IV access line weekly and PRN  (Home infusion instructions - [facility] )        02/05/21 0946            Follow-up Information     VTommy Medal CLavell Islam MD Follow up on 02/21/2021.   Specialty: Infectious Diseases Why: Friday November 18th at 9 am. [id number] Follow up on 03/12/2021.   Why: PCP appointment on December 7 th at 1:50 pm with Dr. NMargarita Rana bring photo ID to appointment.  Contact information: [place] [phone] [facility] Follow up on 02/13/2021.   Why: please arrive at 1:00 pm DO NOT EAT AFTER 9:30 AM THAT DAY. YOU CAN DRINK THAT DAY BUT DO NOT EAT AFTER 9:30 AM... Contact information: [place] phone # [phone] contact name: Vickie                Signed: Annalee Genta 02/05/2021, 10:47 AM

## 2021-02-02 ENCOUNTER — Inpatient Hospital Stay (HOSPITAL_COMMUNITY): Payer: Self-pay

## 2021-02-02 DIAGNOSIS — N7093 Salpingitis and oophoritis, unspecified: Principal | ICD-10-CM

## 2021-02-02 DIAGNOSIS — R509 Fever, unspecified: Secondary | ICD-10-CM

## 2021-02-02 DIAGNOSIS — A599 Trichomoniasis, unspecified: Secondary | ICD-10-CM | POA: Diagnosis present

## 2021-02-02 DIAGNOSIS — A5901 Trichomonal vulvovaginitis: Secondary | ICD-10-CM

## 2021-02-02 DIAGNOSIS — R1900 Intra-abdominal and pelvic swelling, mass and lump, unspecified site: Secondary | ICD-10-CM

## 2021-02-02 LAB — CBC
HCT: 32.2 % — ABNORMAL LOW (ref 36.0–46.0)
Hemoglobin: 10.5 g/dL — ABNORMAL LOW (ref 12.0–15.0)
MCH: 28.8 pg (ref 26.0–34.0)
MCHC: 32.6 g/dL (ref 30.0–36.0)
MCV: 88.2 fL (ref 80.0–100.0)
Platelets: 461 10*3/uL — ABNORMAL HIGH (ref 150–400)
RBC: 3.65 MIL/uL — ABNORMAL LOW (ref 3.87–5.11)
RDW: 12.8 % (ref 11.5–15.5)
WBC: 12.7 10*3/uL — ABNORMAL HIGH (ref 4.0–10.5)
nRBC: 0 % (ref 0.0–0.2)

## 2021-02-02 LAB — HIV ANTIBODY (ROUTINE TESTING W REFLEX): HIV Screen 4th Generation wRfx: NONREACTIVE

## 2021-02-02 MED ORDER — ACETAMINOPHEN 325 MG PO TABS
650.0000 mg | ORAL_TABLET | ORAL | Status: DC | PRN
  Administered 2021-02-02 (×2): 650 mg via ORAL
  Filled 2021-02-02 (×2): qty 2

## 2021-02-02 MED ORDER — IOHEXOL 9 MG/ML PO SOLN
ORAL | Status: AC
  Administered 2021-02-02: 500 mL
  Filled 2021-02-02: qty 1000

## 2021-02-02 MED ORDER — SODIUM CHLORIDE 0.9 % IV SOLN
3.0000 g | Freq: Four times a day (QID) | INTRAVENOUS | Status: DC
  Administered 2021-02-02 – 2021-02-03 (×3): 3 g via INTRAVENOUS
  Filled 2021-02-02 (×3): qty 8

## 2021-02-02 MED ORDER — METRONIDAZOLE 500 MG PO TABS
2000.0000 mg | ORAL_TABLET | Freq: Once | ORAL | Status: AC
  Administered 2021-02-02: 2000 mg via ORAL
  Filled 2021-02-02: qty 4

## 2021-02-02 MED ORDER — IOHEXOL 300 MG/ML  SOLN
100.0000 mL | Freq: Once | INTRAMUSCULAR | Status: AC | PRN
  Administered 2021-02-02: 100 mL via INTRAVENOUS

## 2021-02-02 NOTE — Progress Notes (Signed)
Pharmacy Antibiotic Note  Christy Schaefer is a 40 y.o. female admitted on 01/29/2021 with ovarian abscess currently on PO Augmentin and doxycycline. Pharmacy has been consulted for switching augmentin to unasyn. Pt also received one dose of flagyl 2000mg  today. Plan for IR drain replacement and likely will culture purulent material. I clarified with Dr. on if to continue Doxycycline and if need to add IV flagyl. Dr. Daiva Eves wants to leave them as is and adjust antibiotics tomorrow after IR.  Renal fx wnl  Plan: Unasyn 3g IV Q 6 hrs D/c Augmentin  F/u plans after IR tomorrow   Height: 5\' 4"  (162.6 cm) Weight: 105.7 kg (233 lb) IBW/kg (Calculated) : 54.7  Temp (24hrs), Avg:99.6 F (37.6 C), Min:98.1 F (36.7 C), Max:101.8 F (38.8 C)  Recent Labs  Lab 01/27/21 1809 01/29/21 1636 01/29/21 1651 01/31/21 0708 02/02/21 0916  WBC 12.5*  --  13.7* 14.5* 12.7*  CREATININE 0.57 0.69  --   --   --     Estimated Creatinine Clearance: 113 mL/min (by C-G formula based on SCr of 0.69 mg/dL).    No Known Allergies  Antimicrobials this admission:  Unasyn 10/30 >> -cefotetan 1gm q12h (10/26>10/2&)> 2gm iv q12h (10/27>10/29) --augmentin 875mg  q12h (10/29> 10/30) -Doxy 100mg  iv q12h (10/26>10/29)>>100mg  po q 12h (10/29> -Flaygy 500mg  iv q6h (10/26>10/27); 2g PO x 1 on 10/30 -diflucan 150mg  once (10/28)  Dose adjustments this admission:   Microbiology results: 10/27 abscess cx -   Thank you for allowing pharmacy to be a part of this patient's care.  , PharmD, BCPS, BCPPS Clinical Pharmacist  Pager: [phone]  02/02/2021 7:26 PM

## 2021-02-02 NOTE — Progress Notes (Signed)
GYN Note Rpt CT showed reaccumulation of 16cm fluid collection. D/w ID and will switch to unasyn and d/c po abx for now. Pt made NPO after MN in anticipation for possible rpt IR drainage tomorrow. I spoke with oncall for IR and they are aware of her and will come by tomorrow to re-eval.   Patient Vitals for the past 24 hrs:  BP Temp Temp src Pulse Resp SpO2 Height Weight  02/02/21 1853 -- 98.3 F (36.8 C) Oral -- -- -- -- --  02/02/21 1531 (!) 152/91 (!) 101.8 F (38.8 C) Oral (!) 107 19 95 % -- --  02/02/21 1135 139/74 98.9 F (37.2 C) Oral 97 20 98 % -- --  02/02/21 0723 119/80 98.1 F (36.7 C) Oral 96 19 98 % -- --  02/02/21 0600 -- 98.8 F (37.1 C) Oral 96 -- -- -- --  02/02/21 0502 -- 100 F (37.8 C) Oral 100 20 -- -- --  02/02/21 0354 (!) 135/91 (!) 101.8 F (38.8 C) Oral (!) 126 18 96 % -- --  02/01/21 2259 122/77 99.2 F (37.3 C) Oral (!) 215 18 95 % -- --  02/01/21 2035 -- -- -- -- -- -- 5\' 4"  (1.626 m) 105.7 kg  02/01/21 1916 119/88 98.3 F (36.8 C) Oral 97 18 97 % -- --     02/03/21 MD Attending Center for [facility] GYN Consult Phone: [phone] (M-F, [phone]) & [phone] (Off hours, weekends, holidays) 02/02/2021 Time: [phone]

## 2021-02-02 NOTE — Consult Note (Signed)
Date of Admission:  01/29/2021          Reason for Consult: Tubo-ovarian abscesses status post drain placements with persistent fevers on antibiotics   Referring Provider: Scheryl Darter, MD   Assessment:  Multiple tubo-ovarian abscesses and also question of potential neoplasm status post IR drainage Positive trichomonas from pelvic exam Morbid obesity History of cholecystectomy  Plan:  Repeat CT of the abdomen pelvis with IV and oral contrast She will likely need further drains placed and purulent material sent for culture I will adjust her antibiotics but would like to wait until she has had imaging and repeat IR guided drainage of abscess(es) HIV antibody and HIV RNA and if negative consider PrEP  Principal Problem:   TOA (tubo-ovarian abscess)   Scheduled Meds:  iohexol       amoxicillin-clavulanate  1 tablet Oral BID   doxycycline  100 mg Oral Q12H   prenatal multivitamin  1 tablet Oral Q1200   sodium chloride flush  10 mL Intracatheter Q12H   Continuous Infusions:  sodium chloride Stopped (02/01/21 0959)   PRN Meds:.sodium chloride, acetaminophen, alum & mag hydroxide-simeth, HYDROmorphone (DILAUDID) injection, ketorolac, oxyCODONE-acetaminophen, senna-docusate  HPI: Christy Schaefer is a 40 y.o. female who originally had presented to the ER on October 22 with severe abdominal pain distention nausea and vomiting.  Ultrasound had showed a complex solid and cystic pelvic masses filling the pelvis which with concern for neoplasm +/- PID.  She was seen by obstetrics and gynecology and MRI was planned.  He had pelvic exam with gonorrhea and Chlamydia testing negative but test positive for trichomonas.  She was given ceftriaxone doxycycline and metronidazole in the hospital and discharged on doxycycline and metronidazole but had worsening pain and fevers.  She came back to the ER and was admitted on 22 October. CT showed a large confluent pelvic mass that included the  uterus as well as cystic masses that measure up to 10 cm in diameter along with some normal ovarian tissue.  There is also a 3.5 cm lesion in the left hepatic lobe.  Concern for malignancy plus or minus PID was again raised and MRI was performed.  MRI done on the 22nd also showed a multicystic multiloculated pelvic process concerning for tubo-ovarian abscess with possible con commended neoplasm and/or endometriosis.  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  She was placed on doxycycline and cefotetan and metronidazole in 26 October.  Interventional radiology placed 2 drains encountering frankly purulent material which was sent for cultures.  Cultures have been unrevealing.  Patient was changed yesterday to Augmentin and doxycycline but then had a temperature up to 101.8 degrees last night.  I think she clearly needs repeat CT scan to see if there additional abscesses that are in need of drainage.  Please have intervention radiology send these for culture.  I will then likely change her antibiotics and make metronidazole part of her regimen.  She does not recall any history of STIs in the past and is in a relationship with boyfriend.  Someone must have bypassed the BEST PRACTICE ALERT TO ORDER AN HIV TEST at least TWICE given she had gonorrhea and Chlamydia testing on the 22nd and the 26 but there is no HIV test in the chart.  She should have also had an HIV test as part of the algorithm for admission.  Being an African-American woman living in the [place] she is at exceedingly high risk for HIV infection.  If she  is not HIV positive she would benefit from HIV preexposure prophylaxis.  I spent 81 minutes with the patient including than 50% of the time in face to face counseling of the patient guarding the nature of ovarian abscesses, trichomonas, personally reviewing CT of the abdomen pelvis performed on October 26along with MRI of the abdomen pelvis performed that day along with CBC BMP  gonorrhea chlamydia trichomonas Candida testing from pelvic exam cultures from abscess along with review of medical records in preparation for the visit and during the visit and in coordination of her care.   Interventional radiology Review of Systems: Review of Systems  Constitutional:  Positive for chills and fever. Negative for malaise/fatigue and weight loss.  HENT:  Negative for congestion and sore throat.   Eyes:  Negative for blurred vision and photophobia.  Respiratory:  Negative for cough, shortness of breath and wheezing.   Cardiovascular:  Negative for chest pain, palpitations and leg swelling.  Gastrointestinal:  Positive for abdominal pain, nausea and vomiting. Negative for blood in stool, constipation, diarrhea, heartburn and melena.  Genitourinary:  Negative for dysuria, flank pain and hematuria.  Musculoskeletal:  Negative for back pain, falls, joint pain and myalgias.  Skin:  Negative for itching and rash.  Neurological:  Negative for dizziness, focal weakness, loss of consciousness, weakness and headaches.  Endo/Heme/Allergies:  Does not bruise/bleed easily.  Psychiatric/Behavioral:  Negative for depression and suicidal ideas. The patient does not have insomnia.    History reviewed. No pertinent past medical history.  Social History   Tobacco Use   Smoking status: Every Day    Packs/day: 0.75    Types: Cigarettes   Smokeless tobacco: Never  Vaping Use   Vaping Use: Never used  Substance Use Topics   Alcohol use: Yes    Comment: rare   Drug use: Never    History reviewed. No pertinent family history. No Known Allergies  OBJECTIVE: Blood pressure 139/74, pulse 97, temperature 98.9 F (37.2 C), temperature source Oral, resp. rate 20, height 5\' 4"  (1.626 m), weight 105.7 kg, last menstrual period 01/22/2021, SpO2 98 %.  Physical Exam Constitutional:      General: She is not in acute distress.    Appearance: Normal appearance. She is well-developed. She is  obese. She is not ill-appearing or diaphoretic.  HENT:     Head: Normocephalic and atraumatic.     Right Ear: Hearing and external ear normal.     Left Ear: Hearing and external ear normal.     Nose: No nasal deformity or rhinorrhea.  Eyes:     General: No scleral icterus.    Conjunctiva/sclera: Conjunctivae normal.     Right eye: Right conjunctiva is not injected.     Left eye: Left conjunctiva is not injected.     Pupils: Pupils are equal, round, and reactive to light.  Neck:     Vascular: No JVD.  Cardiovascular:     Rate and Rhythm: Normal rate and regular rhythm.     Heart sounds: Normal heart sounds, S1 normal and S2 normal. No murmur heard.   No friction rub. No gallop.  Pulmonary:     Effort: Pulmonary effort is normal. No respiratory distress.     Breath sounds: No stridor. No wheezing.  Abdominal:     General: There is distension.     Palpations: Abdomen is soft.     Tenderness: There is abdominal tenderness.  Musculoskeletal:        General: Normal range of motion.  Right shoulder: Normal.     Left shoulder: Normal.     Cervical back: Normal range of motion and neck supple.     Right hip: Normal.     Left hip: Normal.     Right knee: Normal.     Left knee: Normal.  Lymphadenopathy:     Head:     Right side of head: No submandibular, preauricular or posterior auricular adenopathy.     Left side of head: No submandibular, preauricular or posterior auricular adenopathy.     Cervical: No cervical adenopathy.     Right cervical: No superficial or deep cervical adenopathy.    Left cervical: No superficial or deep cervical adenopathy.  Skin:    General: Skin is warm and dry.     Coloration: Skin is not pale.     Findings: No abrasion, bruising, ecchymosis, erythema, lesion or rash.     Nails: There is no clubbing.  Neurological:     Mental Status: She is alert and oriented to person, place, and time.     Sensory: No sensory deficit.     Coordination:  Coordination normal.     Gait: Gait normal.  Psychiatric:        Attention and Perception: She is attentive.        Speech: Speech normal.        Behavior: Behavior normal. Behavior is cooperative.        Thought Content: Thought content normal.        Judgment: Judgment normal.   Drains in place 1 with purulent material visible in the bag  Lab Results Lab Results  Component Value Date   WBC 12.7 (H) 02/02/2021   HGB 10.5 (L) 02/02/2021   HCT 32.2 (L) 02/02/2021   MCV 88.2 02/02/2021   PLT 461 (H) 02/02/2021    Lab Results  Component Value Date   CREATININE 0.69 01/29/2021   BUN 6 01/29/2021   NA 138 01/29/2021   K 3.1 (L) 01/29/2021   CL 102 01/29/2021   CO2 25 01/29/2021    Lab Results  Component Value Date   ALT 15 01/29/2021   AST 17 01/29/2021   ALKPHOS 54 01/29/2021   BILITOT 0.5 01/29/2021     Microbiology: Recent Results (from the past 240 hour(s))  Wet prep, genital     Status: Abnormal   Collection Time: 01/25/21  3:07 PM   Specimen: PATH Cytology Cervicovaginal Ancillary Only  Result Value Ref Range Status   Yeast Wet Prep HPF POC NONE SEEN NONE SEEN Final   Trich, Wet Prep PRESENT (A) NONE SEEN Final   Clue Cells Wet Prep HPF POC PRESENT (A) NONE SEEN Final   WBC, Wet Prep HPF POC MANY (A) NONE SEEN Final   Sperm NONE SEEN  Final    Comment: Performed at [facility], [address] [place], [place] [address]  Aerobic/Anaerobic Culture w Gram Stain (surgical/deep wound)     Status: None (Preliminary result)   Collection Time: 01/30/21 12:00 PM   Specimen: Abscess  Result Value Ref Range Status   Specimen Description ABSCESS OVARY FALLOPIAN TUBE  Final   Special Requests SPEC 1  Final   Gram Stain   Final    FEW WBC PRESENT,BOTH PMN AND MONONUCLEAR NO ORGANISMS SEEN    Culture   Final    NO GROWTH 3 DAYS NO ANAEROBES ISOLATED; CULTURE IN PROGRESS FOR 5 DAYS Performed at [facility], [address], [place], [place] [address]     Report Status PENDING  Incomplete  Aerobic/Anaerobic Culture w Gram Stain (surgical/deep wound)     Status: None (Preliminary result)   Collection Time: 01/30/21 12:00 PM   Specimen: Abscess  Result Value Ref Range Status   Specimen Description ABSCESS OVARY FALLOPIAN TUBE  Final   Special Requests SPEC 2  Final   Gram Stain   Final    MODERATE WBC PRESENT,BOTH PMN AND MONONUCLEAR NO ORGANISMS SEEN    Culture   Final    NO GROWTH 3 DAYS NO ANAEROBES ISOLATED; CULTURE IN PROGRESS FOR 5 DAYS Performed at [facility], [address] [place], [place] [address]    Report Status PENDING  Incomplete    Acey Lav, MD [facility] for Infectious Disease [facility] [phone] pager  02/02/2021, 12:20 PM

## 2021-02-02 NOTE — Progress Notes (Signed)
   02/02/21 0354  Vital Signs  BP (!) 135/91  BP Location Right Arm  Patient Position (if appropriate) Supine  BP Method Automatic  Pulse Rate (!) 126  Pulse Rate Source Monitor  Resp 18  Temp (!) 101.8 F (38.8 C)  Temp Source Oral  Oxygen Therapy  SpO2 96 %   Dr Debroah Loop notified of temp 101.8 orally at 0355. Order received and followed.  Will recheck temp in an hour.

## 2021-02-02 NOTE — Plan of Care (Signed)
  Problem: Nutrition: Goal: Adequate nutrition will be maintained Outcome: Completed/Met   Problem: Coping: Goal: Level of anxiety will decrease Outcome: Completed/Met   Problem: Elimination: Goal: Will not experience complications related to urinary retention Outcome: Completed/Met   

## 2021-02-02 NOTE — Progress Notes (Signed)
Subjective: Patient reports incisional pain and tolerating PO.    Objective: I have reviewed patient's vital signs, labs, microbiology, and radiology results. Blood pressure 119/80, pulse 96, temperature 98.1 F (36.7 C), temperature source Oral, resp. rate 19, height 5\' 4"  (1.626 m), weight 105.7 kg, last menstrual period 01/22/2021, SpO2 98 %.  General: alert, cooperative, and no distress Resp: effort normal GI: soft, non-tender; bowel sounds normal; no masses,  no organomegaly and incision: clean, dry, and intact  Intake/Output Summary (Last 24 hours) at 02/02/2021 0855 Last data filed at 02/02/2021 0504 Gross per 24 hour  Intake 780 ml  Output 1126 ml  Net -346 ml    Serous drainage in one bag and copious pus in second bag Assessment/Plan: 101.8 temp this AM on Augmentin. Will consult ID  and IR  LOS: 4 days    02/04/2021 02/02/2021, 8:35 AM

## 2021-02-03 ENCOUNTER — Inpatient Hospital Stay (HOSPITAL_COMMUNITY): Payer: Self-pay

## 2021-02-03 DIAGNOSIS — A599 Trichomoniasis, unspecified: Secondary | ICD-10-CM

## 2021-02-03 DIAGNOSIS — L0291 Cutaneous abscess, unspecified: Secondary | ICD-10-CM

## 2021-02-03 DIAGNOSIS — K651 Peritoneal abscess: Secondary | ICD-10-CM

## 2021-02-03 DIAGNOSIS — Z6841 Body Mass Index (BMI) 40.0 and over, adult: Secondary | ICD-10-CM

## 2021-02-03 DIAGNOSIS — R188 Other ascites: Secondary | ICD-10-CM

## 2021-02-03 DIAGNOSIS — I10 Essential (primary) hypertension: Secondary | ICD-10-CM | POA: Clinically undetermined

## 2021-02-03 LAB — CBC WITH DIFFERENTIAL/PLATELET
Abs Immature Granulocytes: 0.07 10*3/uL (ref 0.00–0.07)
Basophils Absolute: 0 10*3/uL (ref 0.0–0.1)
Basophils Relative: 0 %
Eosinophils Absolute: 0.2 10*3/uL (ref 0.0–0.5)
Eosinophils Relative: 2 %
HCT: 33.9 % — ABNORMAL LOW (ref 36.0–46.0)
Hemoglobin: 11.1 g/dL — ABNORMAL LOW (ref 12.0–15.0)
Immature Granulocytes: 1 %
Lymphocytes Relative: 19 %
Lymphs Abs: 2.2 10*3/uL (ref 0.7–4.0)
MCH: 29 pg (ref 26.0–34.0)
MCHC: 32.7 g/dL (ref 30.0–36.0)
MCV: 88.5 fL (ref 80.0–100.0)
Monocytes Absolute: 1.3 10*3/uL — ABNORMAL HIGH (ref 0.1–1.0)
Monocytes Relative: 11 %
Neutro Abs: 8 10*3/uL — ABNORMAL HIGH (ref 1.7–7.7)
Neutrophils Relative %: 67 %
Platelets: 333 10*3/uL (ref 150–400)
RBC: 3.83 MIL/uL — ABNORMAL LOW (ref 3.87–5.11)
RDW: 13.1 % (ref 11.5–15.5)
WBC: 11.8 10*3/uL — ABNORMAL HIGH (ref 4.0–10.5)
nRBC: 0 % (ref 0.0–0.2)

## 2021-02-03 LAB — HEPATITIS B SURFACE ANTIGEN: Hepatitis B Surface Ag: NONREACTIVE

## 2021-02-03 LAB — HEPATITIS A ANTIBODY, TOTAL: hep A Total Ab: NONREACTIVE

## 2021-02-03 LAB — HEPATITIS C ANTIBODY: HCV Ab: NONREACTIVE

## 2021-02-03 MED ORDER — FENTANYL CITRATE (PF) 100 MCG/2ML IJ SOLN
INTRAMUSCULAR | Status: AC | PRN
  Administered 2021-02-03 (×2): 50 ug via INTRAVENOUS
  Administered 2021-02-03 (×2): 25 ug via INTRAVENOUS

## 2021-02-03 MED ORDER — SODIUM CHLORIDE 0.9% FLUSH
5.0000 mL | Freq: Three times a day (TID) | INTRAVENOUS | Status: DC
  Administered 2021-02-03 – 2021-02-05 (×7): 5 mL

## 2021-02-03 MED ORDER — MIDAZOLAM HCL 2 MG/2ML IJ SOLN
INTRAMUSCULAR | Status: AC | PRN
  Administered 2021-02-03 (×3): 1 mg via INTRAVENOUS

## 2021-02-03 MED ORDER — FENTANYL CITRATE (PF) 100 MCG/2ML IJ SOLN
INTRAMUSCULAR | Status: AC
  Filled 2021-02-03: qty 4

## 2021-02-03 MED ORDER — SODIUM CHLORIDE 0.9 % IV SOLN
2.0000 g | INTRAVENOUS | Status: DC
  Administered 2021-02-03 – 2021-02-04 (×2): 2 g via INTRAVENOUS
  Filled 2021-02-03 (×3): qty 20

## 2021-02-03 MED ORDER — LIDOCAINE-EPINEPHRINE 1 %-1:100000 IJ SOLN
INTRAMUSCULAR | Status: AC
  Filled 2021-02-03: qty 1

## 2021-02-03 MED ORDER — MIDAZOLAM HCL 2 MG/2ML IJ SOLN
INTRAMUSCULAR | Status: AC
  Filled 2021-02-03: qty 4

## 2021-02-03 MED ORDER — METRONIDAZOLE 500 MG PO TABS
500.0000 mg | ORAL_TABLET | Freq: Two times a day (BID) | ORAL | Status: DC
  Administered 2021-02-03 – 2021-02-05 (×5): 500 mg via ORAL
  Filled 2021-02-03 (×5): qty 1

## 2021-02-03 NOTE — Plan of Care (Signed)
  Problem: Education: Goal: Knowledge of General Education information will improve Description: Including pain rating scale, medication(s)/side effects and non-pharmacologic comfort measures Outcome: Progressing   Problem: Health Behavior/Discharge Planning: Goal: Ability to manage health-related needs will improve Outcome: Progressing   Problem: Clinical Measurements: Goal: Ability to maintain clinical measurements within normal limits will improve Outcome: Progressing Goal: Will remain free from infection Outcome: Progressing Goal: Diagnostic test results will improve Outcome: Progressing Goal: Respiratory complications will improve Outcome: Progressing Goal: Cardiovascular complication will be avoided Outcome: Progressing   Problem: Activity: Goal: Risk for activity intolerance will decrease Outcome: Progressing   Problem: Elimination: Goal: Will not experience complications related to bowel motility Outcome: Progressing   Problem: Pain Managment: Goal: General experience of comfort will improve Outcome: Progressing   Problem: Safety: Goal: Ability to remain free from injury will improve Outcome: Progressing   Problem: Skin Integrity: Goal: Risk for impaired skin integrity will decrease Outcome: Progressing   

## 2021-02-03 NOTE — Progress Notes (Signed)
Subjective: No new complaints   Antibiotics:  Anti-infectives (From admission, onward)    Start     Dose/Rate Route Frequency Ordered Stop   02/03/21 1330  cefTRIAXone (ROCEPHIN) 2 g in sodium chloride 0.9 % 100 mL IVPB        2 g 200 mL/hr over 30 Minutes Intravenous Every 24 hours 02/03/21 1223     02/03/21 1315  metroNIDAZOLE (FLAGYL) tablet 500 mg        500 mg Oral Every 12 hours 02/03/21 1223     02/02/21 2000  Ampicillin-Sulbactam (UNASYN) 3 g in sodium chloride 0.9 % 100 mL IVPB  Status:  Discontinued        3 g 200 mL/hr over 30 Minutes Intravenous Every 6 hours 02/02/21 1825 02/03/21 1223   02/02/21 1630  metroNIDAZOLE (FLAGYL) tablet 2,000 mg        2,000 mg Oral  Once 02/02/21 1531 02/02/21 1542   02/01/21 1045  amoxicillin-clavulanate (AUGMENTIN) 875-125 MG per tablet 1 tablet  Status:  Discontinued        1 tablet Oral 2 times daily 02/01/21 0954 02/02/21 1825   02/01/21 1045  doxycycline (VIBRA-TABS) tablet 100 mg        100 mg Oral Every 12 hours 02/01/21 0954     01/31/21 0745  fluconazole (DIFLUCAN) tablet 150 mg        150 mg Oral  Once 01/31/21 0651 01/31/21 0813   01/30/21 0830  cefoTEtan (CEFOTAN) 2 g in sodium chloride 0.9 % 100 mL IVPB  Status:  Discontinued        2 g 200 mL/hr over 30 Minutes Intravenous Every 12 hours 01/30/21 0816 02/01/21 0954   01/29/21 1830  metroNIDAZOLE (FLAGYL) IVPB 500 mg  Status:  Discontinued        500 mg 100 mL/hr over 60 Minutes Intravenous Every 6 hours 01/29/21 1736 01/30/21 0816   01/29/21 1800  cefoTEtan (CEFOTAN) 1 g in sodium chloride 0.9 % 100 mL IVPB  Status:  Discontinued        1 g 200 mL/hr over 30 Minutes Intravenous Every 12 hours 01/29/21 1553 01/30/21 0816   01/29/21 1700  doxycycline (VIBRAMYCIN) 100 mg in sodium chloride 0.9 % 250 mL IVPB  Status:  Discontinued        100 mg 125 mL/hr over 120 Minutes Intravenous Every 12 hours 01/29/21 1553 02/01/21 0954   01/29/21 1630  metroNIDAZOLE (FLAGYL)  IVPB 500 mg  Status:  Discontinued        500 mg 100 mL/hr over 60 Minutes Intravenous Every 6 hours 01/29/21 1553 01/29/21 1736       Medications: Scheduled Meds:  doxycycline  100 mg Oral Q12H   lidocaine-EPINEPHrine       metroNIDAZOLE  500 mg Oral Q12H   prenatal multivitamin  1 tablet Oral Q1200   sodium chloride flush  10 mL Intracatheter Q12H   sodium chloride flush  5 mL Intracatheter Q8H   Continuous Infusions:  sodium chloride Stopped (02/01/21 0959)   cefTRIAXone (ROCEPHIN)  IV 2 g (02/03/21 1305)   PRN Meds:.sodium chloride, acetaminophen, alum & mag hydroxide-simeth, HYDROmorphone (DILAUDID) injection, oxyCODONE-acetaminophen, senna-docusate    Objective: Weight change:   Intake/Output Summary (Last 24 hours) at 02/03/2021 1500 Last data filed at 02/03/2021 1150 Gross per 24 hour  Intake --  Output 75 ml  Net -75 ml   Blood pressure 125/75, pulse 89, temperature 97.9 F (36.6 C), temperature  source Oral, resp. rate 18, height 5\' 4"  (1.626 m), weight 105.7 kg, last menstrual period 01/22/2021, SpO2 93 %. Temp:  [97.7 F (36.5 C)-101.8 F (38.8 C)] 97.9 F (36.6 C) (10/31 1348) Pulse Rate:  [80-107] 89 (10/31 1348) Resp:  [12-19] 18 (10/31 1348) BP: (111-152)/(70-97) 125/75 (10/31 1348) SpO2:  [92 %-100 %] 93 % (10/31 1348)  Physical Exam: Physical Exam Constitutional:      General: She is not in acute distress.    Appearance: She is well-developed. She is obese. She is not diaphoretic.  HENT:     Head: Normocephalic and atraumatic.     Right Ear: External ear normal.     Left Ear: External ear normal.     Mouth/Throat:     Pharynx: No oropharyngeal exudate.  Eyes:     General: No scleral icterus.    Conjunctiva/sclera: Conjunctivae normal.     Pupils: Pupils are equal, round, and reactive to light.  Cardiovascular:     Rate and Rhythm: Normal rate and regular rhythm.  Pulmonary:     Effort: Pulmonary effort is normal. No respiratory distress.      Breath sounds: No wheezing or rales.  Abdominal:     General: There is no distension.     Palpations: Abdomen is soft.  Musculoskeletal:        General: No tenderness. Normal range of motion.  Lymphadenopathy:     Cervical: No cervical adenopathy.  Skin:    General: Skin is warm and dry.     Coloration: Skin is not pale.     Findings: No erythema or rash.  Neurological:     General: No focal deficit present.     Mental Status: She is alert and oriented to person, place, and time.     Motor: No abnormal muscle tone.     Coordination: Coordination normal.  Psychiatric:        Mood and Affect: Mood normal.        Behavior: Behavior normal.        Thought Content: Thought content normal.        Judgment: Judgment normal.    4 drains in place  CBC:    BMET No results for input(s): NA, K, CL, CO2, GLUCOSE, BUN, CREATININE, CALCIUM in the last 72 hours.   Liver Panel  No results for input(s): PROT, ALBUMIN, AST, ALT, ALKPHOS, BILITOT, BILIDIR, IBILI in the last 72 hours.     Sedimentation Rate No results for input(s): ESRSEDRATE in the last 72 hours. C-Reactive Protein No results for input(s): CRP in the last 72 hours.  Micro Results: Recent Results (from the past 720 hour(s))  Wet prep, genital     Status: Abnormal   Collection Time: 01/25/21  3:07 PM   Specimen: PATH Cytology Cervicovaginal Ancillary Only  Result Value Ref Range Status   Yeast Wet Prep HPF POC NONE SEEN NONE SEEN Final   Trich, Wet Prep PRESENT (A) NONE SEEN Final   Clue Cells Wet Prep HPF POC PRESENT (A) NONE SEEN Final   WBC, Wet Prep HPF POC MANY (A) NONE SEEN Final   Sperm NONE SEEN  Final    Comment: Performed at [facility], [address] [place], [place] [address]  Aerobic/Anaerobic Culture w Gram Stain (surgical/deep wound)     Status: None (Preliminary result)   Collection Time: 01/30/21 12:00 PM   Specimen: Abscess  Result Value Ref Range Status   Specimen  Description ABSCESS OVARY  FALLOPIAN TUBE  Final   Special Requests SPEC 1  Final   Gram Stain   Final    FEW WBC PRESENT,BOTH PMN AND MONONUCLEAR NO ORGANISMS SEEN    Culture   Final    NO GROWTH 4 DAYS NO ANAEROBES ISOLATED; CULTURE IN PROGRESS FOR 5 DAYS Performed at [facility], [address], [place], [place] [address]    Report Status PENDING  Incomplete  Aerobic/Anaerobic Culture w Gram Stain (surgical/deep wound)     Status: None (Preliminary result)   Collection Time: 01/30/21 12:00 PM   Specimen: Abscess  Result Value Ref Range Status   Specimen Description ABSCESS OVARY FALLOPIAN TUBE  Final   Special Requests SPEC 2  Final   Gram Stain   Final    MODERATE WBC PRESENT,BOTH PMN AND MONONUCLEAR NO ORGANISMS SEEN    Culture   Final    NO GROWTH 4 DAYS NO ANAEROBES ISOLATED; CULTURE IN PROGRESS FOR 5 DAYS Performed at [facility], [address], [place], [place] [address]    Report Status PENDING  Incomplete    Studies/Results: CT ABDOMEN PELVIS W CONTRAST  Result Date: 02/02/2021 CLINICAL DATA:  Intra-abdominal abscess. EXAM: CT ABDOMEN AND PELVIS WITH CONTRAST TECHNIQUE: Multidetector CT imaging of the abdomen and pelvis was performed using the standard protocol following bolus administration of intravenous contrast. CONTRAST:  OMNIPAQUE IOHEXOL 300 MG/ML  SOLN COMPARISON:  CT abdomen pelvis 05/16/2018, CT abdomen pelvis 01/25/2021, MRI abdomen 01/25/2021, CT image guided procedure 01/30/2021 FINDINGS: Lower chest: Bilateral trace pleural effusions. Bilateral lower lobe atelectasis. Hepatobiliary: No focal liver abnormality. Status post cholecystectomy. No biliary dilatation. Pancreas: No focal lesion. Normal pancreatic contour. No surrounding inflammatory changes. No main pancreatic ductal dilatation. Spleen: Normal in size without focal abnormality. Adrenals/Urinary Tract: No adrenal nodule bilaterally. Question interval developing trace delayed left  nephrogram with bilateral distal ureters coursing along the pelvic mass. No striated nephrogram No hydronephrosis. No hydroureter. No definite urothelial thickening. No nephroureterolithiasis. The urinary bladder is unremarkable. Stomach/Bowel: PO contrast reaches the large bowel. Stomach is within normal limits. No evidence of bowel wall thickening or dilatation. Appendix appears normal. Vascular/Lymphatic: No abdominal aorta or iliac aneurysm.Multiple retroperitoneal lymph nodes measure at the upper limits of normal. There is an enlarged left periaortic lymph node measuring up to 1.2 cm (4:46). Prominent but nonenlarged right pelvic wall lymph node (4:75). No abdominal, pelvic, or inguinal lymphadenopathy. Reproductive: Redemonstration of a complex multi-cystic pelvic mass measuring up to least 15.8 x 8.5 x 10.8 cm. Interval decrease in size of the superior most largest in inferior-most largest component. The lesion is noted to be inseparable from the fundus of the uterus. The bilateral ovaries are not definitely identified. Other: Interval placement of two pelvic pigtail catheters terminating within the known complex multicystic mass lesion. Trace free fluid within the pelvis. No free gas. Musculoskeletal: No abdominal wall hernia or abnormality. No suspicious lytic or blastic osseous lesions. No acute displaced fracture. Multilevel degenerative changes of the spine. IMPRESSION: 1. Interval placement of two pelvic catheters terminating within a known 15.8 x 8.5 x 3 8 cm complex multicystic pelvic mass lesion with slight interval decrease in size of the two largest component. As stated on MRI abdomen 01/25/2021, this could represent a tubo-ovarian abscess versus neoplasm versus endometriosis. 2. Multiple retroperitoneal borderline enlarged and prominent lymph nodes. Differential diagnosis includes reactive changes versus pathology. 3. Question interval developing trace delayed left nephrogram with bilateral distal  ureters coursing along the  pelvic mass. No definite hydronephrosis or urothelial thickening. 4. Trace simple free fluid pelvic ascites. Electronically Signed   By: Iven Finn M.D.   On: 02/02/2021 16:27   CT IMAGE GUIDED FLUID DRAIN BY CATHETER  Result Date: 02/03/2021 INDICATION: Complex tubo-ovarian abscesses, post CT-guided percutaneous drainage catheter placement x2 on 01/30/2021. Subsequent abdominal CT performed 02/02/2021 demonstrates several undrained adnexal collections and as such patient presents again for repeat image guided drainage catheter placement. EXAM: CT-GUIDED ADNEXAL DRAINAGE CATHETER PLACEMENT X2 COMPARISON:  CT abdomen pelvis-02/02/2021; 05/16/2018 CT-guided percutaneous catheter placement x2-01/30/2021 Pelvic MRI-01/25/2021 MEDICATIONS: The patient is currently admitted to the hospital and receiving intravenous antibiotics. The antibiotics were administered within an appropriate time frame prior to the initiation of the procedure. ANESTHESIA/SEDATION: Moderate (conscious) sedation was employed during this procedure. A total of Versed 3 mg and Fentanyl 150 mcg was administered intravenously. Moderate Sedation Time: 42 minutes. The patient's level of consciousness and vital signs were monitored continuously by radiology nursing throughout the procedure under my direct supervision. CONTRAST:  None COMPLICATIONS: None immediate. PROCEDURE: Informed written consent was obtained from the patient after a discussion of the risks, benefits and alternatives to treatment. The patient was placed supine on the CT gantry and a pre procedural CT was performed re-demonstrating the known ill-defined complex abscesses within the adnexa bilaterally with dominant ill-defined collection within the right adnexa measuring approximately 5.1 x 4.3 cm (image 30, series 2 and multiloculated collection along the midline of the lower abdomen/pelvis with right lateral component measuring approximately 6.3 x 4.3  cm (image 22, series 2, dominant collection within the midline of the abdomen measuring approximately 7.8 x 5.6 cm (image 13, series 2 and collection within the left side of the pelvis measuring approximately 3.6 x 3.4 cm (image 20, series 2). The CT gantry table position was marked and the ill-defined collections were identified sonographically. The procedure was planned. A timeout was performed prior to the initiation of the procedure. The skin overlying the ventral aspect the lower abdomen/pelvis was prepped and draped in the usual sterile fashion. The overlying soft tissues were anesthetized with 1% lidocaine with epinephrine. Under direct ultrasound guidance, the dominant collection within the right adnexa was targeted with an 18 gauge trocar needle allowing placement of a short Amplatz wire. Multiple ultrasound images were saved for procedural documentation purposes Next, under direct ultrasound guidance, the adjacent collections within the more midline of the lower abdomen/pelvis was targeted utilizing a right to left approach. This allowed for placement of a regular length Amplatz wire. Multiple ultrasound images were saved procedural documentation purposes. Appropriate position was confirmed with CT imaging. Both tracks were dilated ultimately allowing placement of a 12 French percutaneous drainage catheter within the dominant collection within right pelvis as well as a 12 [place] biliary type multi side-hole drainage catheter traversing multiple adjacent adnexal collections. Approximately 10 cc of slightly blood-tinged serous fluid was aspirated from the dominant collection within the right pelvis. A total of approximately 200 cc of slightly blood-tinged serous fluid was aspirated from all the adjacent ill-defined collections within the more midline of the adnexa as the drainage catheter was slowly retracted ultimately with radiopaque side marker positioned adjacent to the entrance site of the accessed  adnexal collection. A representative sample was capped and sent to the laboratory for analysis. Completion images were obtained (series 7), and the procedure was terminated. Both drainage catheters were secured at the skin entrance sites with interrupted sutures and both drainage catheters were connected to JP bulbs.  Dressings were applied. The patient tolerated the procedure well without immediate post procedural complication. IMPRESSION: 1. Successful ultrasound and CT guided placement of a 12 French all purpose drain catheter into the dominant collection within the right pelvis yielding 10 cc of blood tinged serous fluid. 2. Successful ultrasound and CT-guided placement of a 12 French multi side-hole biliary type drainage catheter traversing several adjacent adnexal collections yielding a total of 200 cc of blood tinged serous fluid. A representative sample of aspirated fluid was capped and sent to the laboratory for analysis. Electronically Signed   By: Sandi Mariscal M.D.   On: 02/03/2021 13:26   CT IMAGE GUIDED DRAINAGE BY PERCUTANEOUS CATHETER  Result Date: 02/03/2021 INDICATION: Complex tubo-ovarian abscesses, post CT-guided percutaneous drainage catheter placement x2 on 01/30/2021. Subsequent abdominal CT performed 02/02/2021 demonstrates several undrained adnexal collections and as such patient presents again for repeat image guided drainage catheter placement. EXAM: CT-GUIDED ADNEXAL DRAINAGE CATHETER PLACEMENT X2 COMPARISON:  CT abdomen pelvis-02/02/2021; 05/16/2018 CT-guided percutaneous catheter placement x2-01/30/2021 Pelvic MRI-01/25/2021 MEDICATIONS: The patient is currently admitted to the hospital and receiving intravenous antibiotics. The antibiotics were administered within an appropriate time frame prior to the initiation of the procedure. ANESTHESIA/SEDATION: Moderate (conscious) sedation was employed during this procedure. A total of Versed 3 mg and Fentanyl 150 mcg was administered  intravenously. Moderate Sedation Time: 42 minutes. The patient's level of consciousness and vital signs were monitored continuously by radiology nursing throughout the procedure under my direct supervision. CONTRAST:  None COMPLICATIONS: None immediate. PROCEDURE: Informed written consent was obtained from the patient after a discussion of the risks, benefits and alternatives to treatment. The patient was placed supine on the CT gantry and a pre procedural CT was performed re-demonstrating the known ill-defined complex abscesses within the adnexa bilaterally with dominant ill-defined collection within the right adnexa measuring approximately 5.1 x 4.3 cm (image 30, series 2 and multiloculated collection along the midline of the lower abdomen/pelvis with right lateral component measuring approximately 6.3 x 4.3 cm (image 22, series 2, dominant collection within the midline of the abdomen measuring approximately 7.8 x 5.6 cm (image 13, series 2 and collection within the left side of the pelvis measuring approximately 3.6 x 3.4 cm (image 20, series 2). The CT gantry table position was marked and the ill-defined collections were identified sonographically. The procedure was planned. A timeout was performed prior to the initiation of the procedure. The skin overlying the ventral aspect the lower abdomen/pelvis was prepped and draped in the usual sterile fashion. The overlying soft tissues were anesthetized with 1% lidocaine with epinephrine. Under direct ultrasound guidance, the dominant collection within the right adnexa was targeted with an 18 gauge trocar needle allowing placement of a short Amplatz wire. Multiple ultrasound images were saved for procedural documentation purposes Next, under direct ultrasound guidance, the adjacent collections within the more midline of the lower abdomen/pelvis was targeted utilizing a right to left approach. This allowed for placement of a regular length Amplatz wire. Multiple  ultrasound images were saved procedural documentation purposes. Appropriate position was confirmed with CT imaging. Both tracks were dilated ultimately allowing placement of a 12 French percutaneous drainage catheter within the dominant collection within right pelvis as well as a 12 [place] biliary type multi side-hole drainage catheter traversing multiple adjacent adnexal collections. Approximately 10 cc of slightly blood-tinged serous fluid was aspirated from the dominant collection within the right pelvis. A total of approximately 200 cc of slightly blood-tinged serous fluid was aspirated from all the  adjacent ill-defined collections within the more midline of the adnexa as the drainage catheter was slowly retracted ultimately with radiopaque side marker positioned adjacent to the entrance site of the accessed adnexal collection. A representative sample was capped and sent to the laboratory for analysis. Completion images were obtained (series 7), and the procedure was terminated. Both drainage catheters were secured at the skin entrance sites with interrupted sutures and both drainage catheters were connected to JP bulbs. Dressings were applied. The patient tolerated the procedure well without immediate post procedural complication. IMPRESSION: 1. Successful ultrasound and CT guided placement of a 12 French all purpose drain catheter into the dominant collection within the right pelvis yielding 10 cc of blood tinged serous fluid. 2. Successful ultrasound and CT-guided placement of a 12 French multi side-hole biliary type drainage catheter traversing several adjacent adnexal collections yielding a total of 200 cc of blood tinged serous fluid. A representative sample of aspirated fluid was capped and sent to the laboratory for analysis. Electronically Signed   By: Sandi Mariscal M.D.   On: 02/03/2021 13:26      Assessment/Plan:  INTERVAL HISTORY: IR had placed 2 new drains and sent material for  culture   Principal Problem:   TOA (tubo-ovarian abscess) Active Problems:   Trichomoniasis   BMI 40.0-44.9, adult ([place])   Hypertension    Yolando Mcginn is a 40 y.o. female with multiple tubo-ovarian abscesses (vs malignancy) sp drains placed with culture not yielding an organism, fevering through augmentin, doxycycline.  Trichomonas was positive from pelvic exam.    #1 Tubo-ovarian abscesses (vs malignancy)  Repeat cultures taken  She now has 4 drains in place  I have changed her to ceftriaxone with metronidazole  Hopefully she improves and we can come up with a plan of treatment as an outpatient.  May involve parenteral antibiotics with IV ceftriaxone--this would also require charity care but something we will consider  #2 HIV testing: HIV ab negative RNA pending  She would benefit from being on PrEP, preferably [place] Apretude though could consider Descovy for PrEP in meantime  I spent 36 minutes with the patient including  face to face counseling of the patient guarding her tubo-ovarian abscesses, pathology HIV testing and HIV prevention, along with personally reviewing CT abdomen pelvis updated culture data CBC BMP, along with pertinent laboratory microbiological, virological data, along with review of medical records in preparation for the visit and during the visit and in coordination of her care.    LOS: 5 days   Alcide Evener 02/03/2021, 3:00 PM

## 2021-02-03 NOTE — Progress Notes (Signed)
Chief Complaint: Patient was seen today for tubovarian abscesses   Supervising Physician: Malachy Moan  Patient Status: [facility] - In-pt  Subjective: Pt with multiloculated tubovarian abscesses. S/p perc drain x 2 on 10/27.  Not much clinical improvement with persistent leukocytosis. Repeat CT yesterday shows redemonstration of a complex multi-cystic pelvic mass measuring up to least 15.8 x 8.5 x 10.8 cm. Interval decrease in size of the superior most largest in inferior-most largest component. The lesion is noted to be inseparable from the fundus of the uterus. IR is asked to review imaging for additional drainage of undrained fluid component  Objective: Physical Exam: BP (!) 145/90 (BP Location: Right Arm)   Pulse 84   Temp 98.1 F (36.7 C) (Oral)   Resp 18   Ht 5\' 4"  (1.626 m)   Wt 105.7 kg   LMP 01/22/2021 (Approximate)   SpO2 94%   BMI 39.99 kg/m  Abd: soft, mildly tender. Lower abd drains intact, sites clean,    Current Facility-Administered Medications:    0.9 %  sodium chloride infusion, , Intravenous, PRN, 01/24/2021, MD, Stopped at 02/01/21 [phone]   acetaminophen (TYLENOL) tablet 650 mg, 650 mg, Oral, Q4H PRN, 1610, MD, 650 mg at 02/02/21 1542   alum & mag hydroxide-simeth (MAALOX/MYLANTA) 200-200-20 MG/5ML suspension 15 mL, 15 mL, Oral, Q4H PRN, 10-13-2000, MD   Ampicillin-Sulbactam (UNASYN) 3 g in sodium chloride 0.9 % 100 mL IVPB, 3 g, Intravenous, Q6H, Bell, Mei W, RPH, Last Rate: 200 mL/hr at 02/03/21 0817, 3 g at 02/03/21 0817   doxycycline (VIBRA-TABS) tablet 100 mg, 100 mg, Oral, Q12H, 02/05/21, MD, 100 mg at 02/02/21 2142   HYDROmorphone (DILAUDID) injection 1 mg, 1 mg, Intravenous, Q4H PRN, 2143, MD, 1 mg at 02/03/21 02/05/21   oxyCODONE-acetaminophen (PERCOCET/ROXICET) 5-325 MG per tablet 1-2 tablet, 1-2 tablet, Oral, Q3H PRN, 9604, MD, 2 tablet at 02/02/21 0405   prenatal multivitamin  tablet 1 tablet, 1 tablet, Oral, Q1200, 02/04/21, MD, 1 tablet at 02/02/21 1321   senna-docusate (Senokot-S) tablet 1 tablet, 1 tablet, Oral, QHS PRN, 02/04/21, MD   sodium chloride flush (NS) 0.9 % injection 10 mL, 10 mL, Intracatheter, Q12H, Mugweru, Jon, MD, 10 mL at 02/02/21 2200  Labs: CBC Recent Labs    02/02/21 0916 02/03/21 0458  WBC 12.7* 11.8*  HGB 10.5* 11.1*  HCT 32.2* 33.9*  PLT 461* 333   BMET No results for input(s): NA, K, CL, CO2, GLUCOSE, BUN, CREATININE, CALCIUM in the last 72 hours. LFT No results for input(s): PROT, ALBUMIN, AST, ALT, ALKPHOS, BILITOT, BILIDIR, IBILI, LIPASE in the last 72 hours. PT/INR No results for input(s): LABPROT, INR in the last 72 hours.   Studies/Results: CT ABDOMEN PELVIS W CONTRAST  Result Date: 02/02/2021 CLINICAL DATA:  Intra-abdominal abscess. EXAM: CT ABDOMEN AND PELVIS WITH CONTRAST TECHNIQUE: Multidetector CT imaging of the abdomen and pelvis was performed using the standard protocol following bolus administration of intravenous contrast. CONTRAST:  02/04/2021 OMNIPAQUE IOHEXOL 300 MG/ML  SOLN COMPARISON:  CT abdomen pelvis 05/16/2018, CT abdomen pelvis 01/25/2021, MRI abdomen 01/25/2021, CT image guided procedure 01/30/2021 FINDINGS: Lower chest: Bilateral trace pleural effusions. Bilateral lower lobe atelectasis. Hepatobiliary: No focal liver abnormality. Status post cholecystectomy. No biliary dilatation. Pancreas: No focal lesion. Normal pancreatic contour. No surrounding inflammatory changes. No main pancreatic ductal dilatation. Spleen: Normal in size without focal abnormality. Adrenals/Urinary Tract: No adrenal nodule bilaterally. Question interval  developing trace delayed left nephrogram with bilateral distal ureters coursing along the pelvic mass. No striated nephrogram No hydronephrosis. No hydroureter. No definite urothelial thickening. No nephroureterolithiasis. The urinary bladder is unremarkable.  Stomach/Bowel: PO contrast reaches the large bowel. Stomach is within normal limits. No evidence of bowel wall thickening or dilatation. Appendix appears normal. Vascular/Lymphatic: No abdominal aorta or iliac aneurysm.Multiple retroperitoneal lymph nodes measure at the upper limits of normal. There is an enlarged left periaortic lymph node measuring up to 1.2 cm (4:46). Prominent but nonenlarged right pelvic wall lymph node (4:75). No abdominal, pelvic, or inguinal lymphadenopathy. Reproductive: Redemonstration of a complex multi-cystic pelvic mass measuring up to least 15.8 x 8.5 x 10.8 cm. Interval decrease in size of the superior most largest in inferior-most largest component. The lesion is noted to be inseparable from the fundus of the uterus. The bilateral ovaries are not definitely identified. Other: Interval placement of two pelvic pigtail catheters terminating within the known complex multicystic mass lesion. Trace free fluid within the pelvis. No free gas. Musculoskeletal: No abdominal wall hernia or abnormality. No suspicious lytic or blastic osseous lesions. No acute displaced fracture. Multilevel degenerative changes of the spine. IMPRESSION: 1. Interval placement of two pelvic catheters terminating within a known 15.8 x 8.5 x 3 8 cm complex multicystic pelvic mass lesion with slight interval decrease in size of the two largest component. As stated on MRI abdomen 01/25/2021, this could represent a tubo-ovarian abscess versus neoplasm versus endometriosis. 2. Multiple retroperitoneal borderline enlarged and prominent lymph nodes. Differential diagnosis includes reactive changes versus pathology. 3. Question interval developing trace delayed left nephrogram with bilateral distal ureters coursing along the pelvic mass. No definite hydronephrosis or urothelial thickening. 4. Trace simple free fluid pelvic ascites. Electronically Signed   By: Tish Frederickson M.D.   On: 02/02/2021 16:27     Assessment/Plan: Complex multi-cystic pelvic mass/abscess measuring up to least 15.8 x 8.5 x 10.8 cm S/p perc drains x 2 on 10/27 Imaging reviewed. Can place additional drain to undrained collection. Discussed with pt possibility that despite numerous drains, she may not improve given the nature of the mass/abscess. Risks and benefits discussed with the patient including bleeding, infection, damage to adjacent structures, bowel perforation/fistula connection, and sepsis.  All of the patient's questions were answered, patient is agreeable to proceed. Consent signed and in chart.    LOS: 5 days   I spent a total of 15 minutes in face to face in clinical consultation, greater than 50% of which was counseling/coordinating care for abscess drain  Brayton El PA-C 02/03/2021 9:01 AM

## 2021-02-03 NOTE — Procedures (Signed)
Pre procedural Dx: TOA with residual collections despite previous percutaneous drain placement Post procedural Dx: Same  Technically successful CT guided placement of a 12 Fr drainage catheter placement into the residual collection within the right lower uterine segment yielding 10 cc of bloody serous fluid.  Technically successful CT guided placement of a 12 Fr biliary type drainage catheter traversing 2-3 residual uterine collections yielding a total of 200 cc of bloody serous fluid. A representative aspirated sample was capped and sent to the laboratory for analysis.    Both drains connected to JP bulbs.   EBL: Trace Complications: None immediate  Katherina Right, MD Pager #: [phone]

## 2021-02-03 NOTE — Progress Notes (Addendum)
Gynecology Progress Note  Admission Date: 01/29/2021 Current Date: 02/03/2021 7:08 AM  Christy Schaefer is a 40 y.o. HD#6/POD#4 admitted for large pelvic cysts/fluid collections with presumed TOAs s/p IR drainage.    History complicated by: Patient Active Problem List   Diagnosis Date Noted   BMI 40.0-44.9, adult (HCC) 02/03/2021   Hypertension 02/03/2021   Trichomoniasis 02/02/2021   TOA (tubo-ovarian abscess) 01/29/2021    ROS and patient/family/surgical history, located on admission H&P note dated 01/29/2021, have been reviewed, and there are no changes except as noted below Yesterday/Overnight Events:  Had temp. ID consult. Rpt CT scan showed 16cm fluid collection in abdomen/pelvis. Switched from augmentin/doxy to unasyn/doxy. Flagyl 2gm x 1 given  Subjective:  No nausea, vomiting, fevers, chills; minimal abdominal discomfort.   Objective:    Current Vital Signs 24h Vital Sign Ranges  T 98.9 F (37.2 C) Temp  Avg: 99.1 F (37.3 C)  Min: 98.1 F (36.7 C)  Max: 101.8 F (38.8 C)  BP (!) 120/92  BP  Min: 119/80  Max: 152/91  HR 90 Pulse  Avg: 98.3  Min: 90  Max: 107  RR 18 Resp  Avg: 18.5  Min: 17  Max: 20  SaO2 98 % Room Air SpO2  Avg: 97.2 %  Min: 95 %  Max: 98 %       24 Hour I/O Current Shift I/O  Time Ins Outs 10/30 0701 - 10/31 0700 In: 20  Out: 25 [Drains:25] No intake/output data recorded.   Patient Vitals for the past 24 hrs:  BP Temp Temp src Pulse Resp SpO2  02/03/21 0425 (!) 120/92 98.9 F (37.2 C) Oral 90 18 98 %  02/02/21 2308 (!) 133/92 99.1 F (37.3 C) Oral (!) 102 17 98 %  02/02/21 2100 (!) 131/95 98.6 F (37 C) Oral 98 18 96 %  02/02/21 1853 -- 98.3 F (36.8 C) Oral -- -- --  02/02/21 1531 (!) 152/91 (!) 101.8 F (38.8 C) Oral (!) 107 19 95 %  02/02/21 1135 139/74 98.9 F (37.2 C) Oral 97 20 98 %  02/02/21 0723 119/80 98.1 F (36.7 C) Oral 96 19 98 %    Physical exam: General appearance: alert, cooperative, and appears stated age Abdomen:   obese, soft, nttp, nd. Two IR drains in place with clear fluid in one and clear fluid with white particulate in the other Lungs: clear to auscultation bilaterally Heart: S1, S2 normal, no murmur, rub or gallop, regular rate and rhythm Extremities: no lower extremity edema Skin: warm and dry Psych: appropriate Neurologic: Grossly normal  Medications Current Facility-Administered Medications  Medication Dose Route Frequency Provider Last Rate Last Admin   0.9 %  sodium chloride infusion   Intravenous PRN Federico Flake, MD   Stopped at 02/01/21 0959   acetaminophen (TYLENOL) tablet 650 mg  650 mg Oral Q4H PRN Adam Phenix, MD   650 mg at 02/02/21 1542   alum & mag hydroxide-simeth (MAALOX/MYLANTA) 200-200-20 MG/5ML suspension 15 mL  15 mL Oral Q4H PRN Hermina Staggers, MD       Ampicillin-Sulbactam (UNASYN) 3 g in sodium chloride 0.9 % 100 mL IVPB  3 g Intravenous Q6H Robinette Haines, RPH   Stopped at 02/03/21 0425   doxycycline (VIBRA-TABS) tablet 100 mg  100 mg Oral Q12H Adam Phenix, MD   100 mg at 02/02/21 2142   HYDROmorphone (DILAUDID) injection 1 mg  1 mg Intravenous Q4H PRN Hermina Staggers, MD  1 mg at 02/03/21 0303   oxyCODONE-acetaminophen (PERCOCET/ROXICET) 5-325 MG per tablet 1-2 tablet  1-2 tablet Oral Q3H PRN Federico Flake, MD   2 tablet at 02/02/21 0405   prenatal multivitamin tablet 1 tablet  1 tablet Oral Q1200 Federico Flake, MD   1 tablet at 02/02/21 1321   senna-docusate (Senokot-S) tablet 1 tablet  1 tablet Oral QHS PRN Federico Flake, MD       sodium chloride flush (NS) 0.9 % injection 10 mL  10 mL Intracatheter Q12H Mugweru, Jon, MD   10 mL at 02/02/21 2200      Labs  Pending 10/27 Drain 1 few wbc, no organisms seen  Drain 2 moderate wbc, no organisms seen Hep A antibody, hep c antibody, hep b surface antigen and antibody, hiv 1 rna quant  Neg hiv antibody  Recent Labs  Lab 01/31/21 0708 02/02/21 0916 02/03/21 0458  WBC  14.5* 12.7* 11.8*  HGB 10.7* 10.5* 11.1*  HCT 33.7* 32.2* 33.9*  PLT 517* 461* 333    Recent Labs  Lab 01/27/21 1809 01/29/21 1636  NA 136 138  K 3.0* 3.1*  CL 102 102  CO2 25 25  BUN 7 6  CREATININE 0.57 0.69  CALCIUM 8.9 9.0  PROT 9.2* 8.9*  BILITOT 0.3 0.5  ALKPHOS 55 54  ALT 12 15  AST 12* 17  GLUCOSE 101* 88    Radiology Narrative & Impression  CLINICAL DATA:  Intra-abdominal abscess.   EXAM: CT ABDOMEN AND PELVIS WITH CONTRAST   TECHNIQUE: Multidetector CT imaging of the abdomen and pelvis was performed using the standard protocol following bolus administration of intravenous contrast.   CONTRAST:  OMNIPAQUE IOHEXOL 300 MG/ML  SOLN   COMPARISON:  CT abdomen pelvis 05/16/2018, CT abdomen pelvis 01/25/2021, MRI abdomen 01/25/2021, CT image guided procedure 01/30/2021   FINDINGS: Lower chest: Bilateral trace pleural effusions. Bilateral lower lobe atelectasis.   Hepatobiliary: No focal liver abnormality. Status post cholecystectomy. No biliary dilatation.   Pancreas: No focal lesion. Normal pancreatic contour. No surrounding inflammatory changes. No main pancreatic ductal dilatation.   Spleen: Normal in size without focal abnormality.   Adrenals/Urinary Tract:   No adrenal nodule bilaterally.   Question interval developing trace delayed left nephrogram with bilateral distal ureters coursing along the pelvic mass. No striated nephrogram   No hydronephrosis. No hydroureter. No definite urothelial thickening. No nephroureterolithiasis.   The urinary bladder is unremarkable.   Stomach/Bowel: PO contrast reaches the large bowel. Stomach is within normal limits. No evidence of bowel wall thickening or dilatation. Appendix appears normal.   Vascular/Lymphatic: No abdominal aorta or iliac aneurysm.Multiple retroperitoneal lymph nodes measure at the upper limits of normal. There is an enlarged left periaortic lymph node measuring up to 1.2 cm  (4:46). Prominent but nonenlarged right pelvic wall lymph node (4:75). No abdominal, pelvic, or inguinal lymphadenopathy.   Reproductive: Redemonstration of a complex multi-cystic pelvic mass measuring up to least 15.8 x 8.5 x 10.8 cm. Interval decrease in size of the superior most largest in inferior-most largest component. The lesion is noted to be inseparable from the fundus of the uterus. The bilateral ovaries are not definitely identified.   Other: Interval placement of two pelvic pigtail catheters terminating within the known complex multicystic mass lesion. Trace free fluid within the pelvis. No free gas.   Musculoskeletal:   No abdominal wall hernia or abnormality.   No suspicious lytic or blastic osseous lesions. No acute displaced fracture. Multilevel degenerative  changes of the spine.   IMPRESSION: 1. Interval placement of two pelvic catheters terminating within a known 15.8 x 8.5 x 3 8 cm complex multicystic pelvic mass lesion with slight interval decrease in size of the two largest component. As stated on MRI abdomen 01/25/2021, this could represent a tubo-ovarian abscess versus neoplasm versus endometriosis. 2. Multiple retroperitoneal borderline enlarged and prominent lymph nodes. Differential diagnosis includes reactive changes versus pathology. 3. Question interval developing trace delayed left nephrogram with bilateral distal ureters coursing along the pelvic mass. No definite hydronephrosis or urothelial thickening. 4. Trace simple free fluid pelvic ascites.     Electronically Signed   By: Tish Frederickson M.D.   On: 02/02/2021 16:27    Assessment & Plan:  Pt stable *GYN: pt NPO for possible rpt IR procedure today. IR to come by again and reassess patient.  Continue unasyn d#2 and doxy #5. S/p cefotan x 2 d on admit. S/p augmentin x 2d ID following appreciate recs I wasn't sure if she was treated for the trich so flagyl 2gm given yesterdah *Pain:  controlled on PO PRNs *HTN: no issues on no meds *FEN/GI: restart diet after possible IR procedure. Follow up BMP tomorrow for h/o low k.  *PPx: holding all anti-coagulation *Dispo: potentially end of the week  Code Status: Full Code  Total time taking care of the patient was 25 minutes, with greater than 50% of the time spent in face to face interaction with the patient.  Cornelia Copa MD Attending Center for [facility] GYN Consult Phone: [phone] (M-F, 0800-1700) & [phone] (Off hours, weekends, holidays)

## 2021-02-04 ENCOUNTER — Inpatient Hospital Stay: Payer: Self-pay

## 2021-02-04 LAB — AEROBIC/ANAEROBIC CULTURE W GRAM STAIN (SURGICAL/DEEP WOUND)
Culture: NO GROWTH
Culture: NO GROWTH

## 2021-02-04 LAB — CBC WITH DIFFERENTIAL/PLATELET
Abs Immature Granulocytes: 0.05 10*3/uL (ref 0.00–0.07)
Basophils Absolute: 0 10*3/uL (ref 0.0–0.1)
Basophils Relative: 0 %
Eosinophils Absolute: 0.4 10*3/uL (ref 0.0–0.5)
Eosinophils Relative: 4 %
HCT: 32.6 % — ABNORMAL LOW (ref 36.0–46.0)
Hemoglobin: 10.6 g/dL — ABNORMAL LOW (ref 12.0–15.0)
Immature Granulocytes: 1 %
Lymphocytes Relative: 25 %
Lymphs Abs: 2.3 10*3/uL (ref 0.7–4.0)
MCH: 29 pg (ref 26.0–34.0)
MCHC: 32.5 g/dL (ref 30.0–36.0)
MCV: 89.3 fL (ref 80.0–100.0)
Monocytes Absolute: 0.9 10*3/uL (ref 0.1–1.0)
Monocytes Relative: 10 %
Neutro Abs: 5.5 10*3/uL (ref 1.7–7.7)
Neutrophils Relative %: 60 %
Platelets: 472 10*3/uL — ABNORMAL HIGH (ref 150–400)
RBC: 3.65 MIL/uL — ABNORMAL LOW (ref 3.87–5.11)
RDW: 13.2 % (ref 11.5–15.5)
WBC: 9.1 10*3/uL (ref 4.0–10.5)
nRBC: 0 % (ref 0.0–0.2)

## 2021-02-04 LAB — BASIC METABOLIC PANEL
Anion gap: 9 (ref 5–15)
BUN: <5 mg/dL — ABNORMAL LOW (ref 6–20)
CO2: 27 mmol/L (ref 22–32)
Calcium: 8.2 mg/dL — ABNORMAL LOW (ref 8.9–10.3)
Chloride: 96 mmol/L — ABNORMAL LOW (ref 98–111)
Creatinine, Ser: 0.57 mg/dL (ref 0.44–1.00)
GFR, Estimated: >60 mL/min (ref >60)
Glucose, Bld: 104 mg/dL — ABNORMAL HIGH (ref 70–99)
Potassium: 3.2 mmol/L — ABNORMAL LOW (ref 3.5–5.1)
Sodium: 132 mmol/L — ABNORMAL LOW (ref 135–145)

## 2021-02-04 LAB — HIV-1 RNA QUANT-NO REFLEX-BLD
HIV 1 RNA Quant: <20 copies/mL
LOG10 HIV-1 RNA: UNDETERMINED log10copy/mL

## 2021-02-04 LAB — HEPATITIS B SURFACE ANTIBODY, QUANTITATIVE: Hep B S AB Quant (Post): 35.8 m[IU]/mL (ref >9.9)

## 2021-02-04 MED ORDER — CHLORHEXIDINE GLUCONATE CLOTH 2 % EX PADS
6.0000 | MEDICATED_PAD | Freq: Every day | CUTANEOUS | Status: DC
  Administered 2021-02-04: 6 via TOPICAL

## 2021-02-04 MED ORDER — KETOROLAC TROMETHAMINE 10 MG PO TABS
10.0000 mg | ORAL_TABLET | Freq: Three times a day (TID) | ORAL | Status: DC
  Administered 2021-02-04 – 2021-02-05 (×4): 10 mg via ORAL
  Filled 2021-02-04 (×6): qty 1

## 2021-02-04 MED ORDER — SODIUM CHLORIDE 0.9% FLUSH: 10.0000 mL | INTRAVENOUS | Status: DC | PRN

## 2021-02-04 MED ORDER — SODIUM CHLORIDE 0.9% FLUSH
10.0000 mL | Freq: Two times a day (BID) | INTRAVENOUS | Status: DC
  Administered 2021-02-04: 10 mL

## 2021-02-04 NOTE — Progress Notes (Signed)
PHARMACY CONSULT NOTE FOR:  OUTPATIENT  PARENTERAL ANTIBIOTIC THERAPY (OPAT)  Indication: Tubo-ovarian abscess Regimen: Ceftriaxone 2 gm IV Q 24 hours + Metronidazole 500 mg PO Q 12 hours End date: 03/03/2021  IV antibiotic discharge orders are pended. To discharging provider:  please sign these orders via discharge navigator,  Select New Orders & click on the button choice - Manage This Unsigned Work.     Thank you for allowing pharmacy to be a part of this patient's care.  Sharin Mons, PharmD, BCPS, BCIDP Infectious Diseases Clinical Pharmacist Phone: [phone] 02/04/2021, 1:26 PM

## 2021-02-04 NOTE — Progress Notes (Signed)
Referring Physician(s): * No referring provider recorded for this case *  Supervising Physician: Gilmer Mor  Patient Status:  [facility] - In-pt  Chief Complaint:  - 01/30/21:Successful CT-guided 2 drain placement of 10 F catheters into hydrosalpinx (midline pelvis) and TOA (supraumbilical abdomen).  - 02/03/21: Successful ultrasound and CT guided placement of a 12 French all purpose drain catheter into the dominant collection within the right pelvis  -Successful ultrasound and CT-guided placement of a 12 French multi side-hole biliary type drainage catheter traversing several adjacent adnexal collections  Subjective: Pt lying in bed talking on cell phone She states she feels better today that yesterday.  She only has pain when flushing proximal RLQ drain. She has no complaints.   Allergies: Patient has no known allergies.  Medications: Prior to Admission medications   Medication Sig Start Date End Date Taking? Authorizing Provider  doxycycline (VIBRAMYCIN) 100 MG capsule Take 1 capsule (100 mg total) by mouth 2 (two) times daily for 14 days. 01/25/21 02/08/21  Loeffler, Finis Bud, PA-C  metroNIDAZOLE (FLAGYL) 500 MG tablet Take 1 tablet (500 mg total) by mouth 2 (two) times daily for 14 days. 01/25/21 02/08/21  Loeffler, Finis Bud, PA-C     Vital Signs: BP 136/78 (BP Location: Right Arm)   Pulse 85   Temp 98 F (36.7 C) (Oral)   Resp 18   Ht 5\' 4"  (1.626 m)   Wt 233 lb (105.7 kg)   LMP 01/22/2021 (Approximate)   SpO2 97%   BMI 39.99 kg/m   Physical Exam Constitutional:      Appearance: Normal appearance. She is not ill-appearing.  HENT:     Head: Normocephalic and atraumatic.  Cardiovascular:     Rate and Rhythm: Normal rate.     Pulses: Normal pulses.  Pulmonary:     Effort: Pulmonary effort is normal.  Abdominal:     General: There is no distension.     Palpations: Abdomen is soft.     Tenderness: There is no abdominal tenderness. There is no guarding.      Comments: Promixal RLQ drain: pt has pain when flushing, aspirated bright red blood-scant ant OP in JP with 33 cc OP in 24 hours. Insertion site unremarkable with sutures/ statlock in place. Dressing C/D/I Distal RLQ drain: scant amt serosanguinous fluid in JP, flushes easily. 27 cc OP in 24 hours. Insertion site unremarkable with sutures/stat lock in place.Dressing C/D/I Midline drain: scant amt yellow fluid in gravity bag with 117 cc documented in Epic. Flushes easily. Insertion site unremakable with sutures/statlock in place. Dressing C/D/I.  LLQ drain: scant amt yellowish fluid to gravity bag with 17 cc documented OP in 24 hours. Flushed easily. Insertion site unremarkable with sutures/statlock in place. Dressing C/D/I.   Skin:    General: Skin is warm and dry.  Neurological:     Mental Status: She is alert and oriented to person, place, and time.  Psychiatric:        Mood and Affect: Mood normal.        Behavior: Behavior normal.        Thought Content: Thought content normal.        Judgment: Judgment normal.    Imaging: CT ABDOMEN PELVIS W CONTRAST  Result Date: 02/02/2021 CLINICAL DATA:  Intra-abdominal abscess. EXAM: CT ABDOMEN AND PELVIS WITH CONTRAST TECHNIQUE: Multidetector CT imaging of the abdomen and pelvis was performed using the standard protocol following bolus administration of intravenous contrast. CONTRAST:  OMNIPAQUE IOHEXOL 300 MG/ML  SOLN COMPARISON:  CT abdomen pelvis 05/16/2018, CT abdomen pelvis 01/25/2021, MRI abdomen 01/25/2021, CT image guided procedure 01/30/2021 FINDINGS: Lower chest: Bilateral trace pleural effusions. Bilateral lower lobe atelectasis. Hepatobiliary: No focal liver abnormality. Status post cholecystectomy. No biliary dilatation. Pancreas: No focal lesion. Normal pancreatic contour. No surrounding inflammatory changes. No main pancreatic ductal dilatation. Spleen: Normal in size without focal abnormality. Adrenals/Urinary Tract: No adrenal  nodule bilaterally. Question interval developing trace delayed left nephrogram with bilateral distal ureters coursing along the pelvic mass. No striated nephrogram No hydronephrosis. No hydroureter. No definite urothelial thickening. No nephroureterolithiasis. The urinary bladder is unremarkable. Stomach/Bowel: PO contrast reaches the large bowel. Stomach is within normal limits. No evidence of bowel wall thickening or dilatation. Appendix appears normal. Vascular/Lymphatic: No abdominal aorta or iliac aneurysm.Multiple retroperitoneal lymph nodes measure at the upper limits of normal. There is an enlarged left periaortic lymph node measuring up to 1.2 cm (4:46). Prominent but nonenlarged right pelvic wall lymph node (4:75). No abdominal, pelvic, or inguinal lymphadenopathy. Reproductive: Redemonstration of a complex multi-cystic pelvic mass measuring up to least 15.8 x 8.5 x 10.8 cm. Interval decrease in size of the superior most largest in inferior-most largest component. The lesion is noted to be inseparable from the fundus of the uterus. The bilateral ovaries are not definitely identified. Other: Interval placement of two pelvic pigtail catheters terminating within the known complex multicystic mass lesion. Trace free fluid within the pelvis. No free gas. Musculoskeletal: No abdominal wall hernia or abnormality. No suspicious lytic or blastic osseous lesions. No acute displaced fracture. Multilevel degenerative changes of the spine. IMPRESSION: 1. Interval placement of two pelvic catheters terminating within a known 15.8 x 8.5 x 3 8 cm complex multicystic pelvic mass lesion with slight interval decrease in size of the two largest component. As stated on MRI abdomen 01/25/2021, this could represent a tubo-ovarian abscess versus neoplasm versus endometriosis. 2. Multiple retroperitoneal borderline enlarged and prominent lymph nodes. Differential diagnosis includes reactive changes versus pathology. 3. Question  interval developing trace delayed left nephrogram with bilateral distal ureters coursing along the pelvic mass. No definite hydronephrosis or urothelial thickening. 4. Trace simple free fluid pelvic ascites. Electronically Signed   By: Tish Frederickson M.D.   On: 02/02/2021 16:27   CT IMAGE GUIDED FLUID DRAIN BY CATHETER  Result Date: 02/03/2021 INDICATION: Complex tubo-ovarian abscesses, post CT-guided percutaneous drainage catheter placement x2 on 01/30/2021. Subsequent abdominal CT performed 02/02/2021 demonstrates several undrained adnexal collections and as such patient presents again for repeat image guided drainage catheter placement. EXAM: CT-GUIDED ADNEXAL DRAINAGE CATHETER PLACEMENT X2 COMPARISON:  CT abdomen pelvis-02/02/2021; 05/16/2018 CT-guided percutaneous catheter placement x2-01/30/2021 Pelvic MRI-01/25/2021 MEDICATIONS: The patient is currently admitted to the hospital and receiving intravenous antibiotics. The antibiotics were administered within an appropriate time frame prior to the initiation of the procedure. ANESTHESIA/SEDATION: Moderate (conscious) sedation was employed during this procedure. A total of Versed 3 mg and Fentanyl 150 mcg was administered intravenously. Moderate Sedation Time: 42 minutes. The patient's level of consciousness and vital signs were monitored continuously by radiology nursing throughout the procedure under my direct supervision. CONTRAST:  None COMPLICATIONS: None immediate. PROCEDURE: Informed written consent was obtained from the patient after a discussion of the risks, benefits and alternatives to treatment. The patient was placed supine on the CT gantry and a pre procedural CT was performed re-demonstrating the known ill-defined complex abscesses within the adnexa bilaterally with dominant ill-defined collection within the right adnexa measuring approximately 5.1 x 4.3 cm (image 30,  series 2 and multiloculated collection along the midline of the lower  abdomen/pelvis with right lateral component measuring approximately 6.3 x 4.3 cm (image 22, series 2, dominant collection within the midline of the abdomen measuring approximately 7.8 x 5.6 cm (image 13, series 2 and collection within the left side of the pelvis measuring approximately 3.6 x 3.4 cm (image 20, series 2). The CT gantry table position was marked and the ill-defined collections were identified sonographically. The procedure was planned. A timeout was performed prior to the initiation of the procedure. The skin overlying the ventral aspect the lower abdomen/pelvis was prepped and draped in the usual sterile fashion. The overlying soft tissues were anesthetized with 1% lidocaine with epinephrine. Under direct ultrasound guidance, the dominant collection within the right adnexa was targeted with an 18 gauge trocar needle allowing placement of a short Amplatz wire. Multiple ultrasound images were saved for procedural documentation purposes Next, under direct ultrasound guidance, the adjacent collections within the more midline of the lower abdomen/pelvis was targeted utilizing a right to left approach. This allowed for placement of a regular length Amplatz wire. Multiple ultrasound images were saved procedural documentation purposes. Appropriate position was confirmed with CT imaging. Both tracks were dilated ultimately allowing placement of a 12 French percutaneous drainage catheter within the dominant collection within right pelvis as well as a 12 [place] biliary type multi side-hole drainage catheter traversing multiple adjacent adnexal collections. Approximately 10 cc of slightly blood-tinged serous fluid was aspirated from the dominant collection within the right pelvis. A total of approximately 200 cc of slightly blood-tinged serous fluid was aspirated from all the adjacent ill-defined collections within the more midline of the adnexa as the drainage catheter was slowly retracted ultimately with  radiopaque side marker positioned adjacent to the entrance site of the accessed adnexal collection. A representative sample was capped and sent to the laboratory for analysis. Completion images were obtained (series 7), and the procedure was terminated. Both drainage catheters were secured at the skin entrance sites with interrupted sutures and both drainage catheters were connected to JP bulbs. Dressings were applied. The patient tolerated the procedure well without immediate post procedural complication. IMPRESSION: 1. Successful ultrasound and CT guided placement of a 12 French all purpose drain catheter into the dominant collection within the right pelvis yielding 10 cc of blood tinged serous fluid. 2. Successful ultrasound and CT-guided placement of a 12 French multi side-hole biliary type drainage catheter traversing several adjacent adnexal collections yielding a total of 200 cc of blood tinged serous fluid. A representative sample of aspirated fluid was capped and sent to the laboratory for analysis. Electronically Signed   By: Simonne Come M.D.   On: 02/03/2021 13:26   CT IMAGE GUIDED DRAINAGE BY PERCUTANEOUS CATHETER  Result Date: 02/03/2021 INDICATION: Complex tubo-ovarian abscesses, post CT-guided percutaneous drainage catheter placement x2 on 01/30/2021. Subsequent abdominal CT performed 02/02/2021 demonstrates several undrained adnexal collections and as such patient presents again for repeat image guided drainage catheter placement. EXAM: CT-GUIDED ADNEXAL DRAINAGE CATHETER PLACEMENT X2 COMPARISON:  CT abdomen pelvis-02/02/2021; 05/16/2018 CT-guided percutaneous catheter placement x2-01/30/2021 Pelvic MRI-01/25/2021 MEDICATIONS: The patient is currently admitted to the hospital and receiving intravenous antibiotics. The antibiotics were administered within an appropriate time frame prior to the initiation of the procedure. ANESTHESIA/SEDATION: Moderate (conscious) sedation was employed during this  procedure. A total of Versed 3 mg and Fentanyl 150 mcg was administered intravenously. Moderate Sedation Time: 42 minutes. The patient's level of consciousness and vital signs were monitored  continuously by radiology nursing throughout the procedure under my direct supervision. CONTRAST:  None COMPLICATIONS: None immediate. PROCEDURE: Informed written consent was obtained from the patient after a discussion of the risks, benefits and alternatives to treatment. The patient was placed supine on the CT gantry and a pre procedural CT was performed re-demonstrating the known ill-defined complex abscesses within the adnexa bilaterally with dominant ill-defined collection within the right adnexa measuring approximately 5.1 x 4.3 cm (image 30, series 2 and multiloculated collection along the midline of the lower abdomen/pelvis with right lateral component measuring approximately 6.3 x 4.3 cm (image 22, series 2, dominant collection within the midline of the abdomen measuring approximately 7.8 x 5.6 cm (image 13, series 2 and collection within the left side of the pelvis measuring approximately 3.6 x 3.4 cm (image 20, series 2). The CT gantry table position was marked and the ill-defined collections were identified sonographically. The procedure was planned. A timeout was performed prior to the initiation of the procedure. The skin overlying the ventral aspect the lower abdomen/pelvis was prepped and draped in the usual sterile fashion. The overlying soft tissues were anesthetized with 1% lidocaine with epinephrine. Under direct ultrasound guidance, the dominant collection within the right adnexa was targeted with an 18 gauge trocar needle allowing placement of a short Amplatz wire. Multiple ultrasound images were saved for procedural documentation purposes Next, under direct ultrasound guidance, the adjacent collections within the more midline of the lower abdomen/pelvis was targeted utilizing a right to left approach. This  allowed for placement of a regular length Amplatz wire. Multiple ultrasound images were saved procedural documentation purposes. Appropriate position was confirmed with CT imaging. Both tracks were dilated ultimately allowing placement of a 12 French percutaneous drainage catheter within the dominant collection within right pelvis as well as a 12 [place] biliary type multi side-hole drainage catheter traversing multiple adjacent adnexal collections. Approximately 10 cc of slightly blood-tinged serous fluid was aspirated from the dominant collection within the right pelvis. A total of approximately 200 cc of slightly blood-tinged serous fluid was aspirated from all the adjacent ill-defined collections within the more midline of the adnexa as the drainage catheter was slowly retracted ultimately with radiopaque side marker positioned adjacent to the entrance site of the accessed adnexal collection. A representative sample was capped and sent to the laboratory for analysis. Completion images were obtained (series 7), and the procedure was terminated. Both drainage catheters were secured at the skin entrance sites with interrupted sutures and both drainage catheters were connected to JP bulbs. Dressings were applied. The patient tolerated the procedure well without immediate post procedural complication. IMPRESSION: 1. Successful ultrasound and CT guided placement of a 12 French all purpose drain catheter into the dominant collection within the right pelvis yielding 10 cc of blood tinged serous fluid. 2. Successful ultrasound and CT-guided placement of a 12 French multi side-hole biliary type drainage catheter traversing several adjacent adnexal collections yielding a total of 200 cc of blood tinged serous fluid. A representative sample of aspirated fluid was capped and sent to the laboratory for analysis. Electronically Signed   By: Simonne Come M.D.   On: 02/03/2021 13:26    Labs:  CBC: Recent Labs     01/31/21 0708 02/02/21 0916 02/03/21 0458 02/04/21 0738  WBC 14.5* 12.7* 11.8* 9.1  HGB 10.7* 10.5* 11.1* 10.6*  HCT 33.7* 32.2* 33.9* 32.6*  PLT 517* 461* 333 472*    COAGS: No results for input(s): INR, APTT in the last 8760  hours.  BMP: Recent Labs    01/25/21 1322 01/27/21 1809 01/29/21 1636 02/04/21 0738  NA 136 136 138 132*  K 3.6 3.0* 3.1* 3.2*  CL 102 102 102 96*  CO2 26 25 25 27   GLUCOSE 94 101* 88 104*  BUN 8 7 6  <5*  CALCIUM 9.0 8.9 9.0 8.2*  CREATININE 0.62 0.57 0.69 0.57  GFRNONAA >60 >60 >60 >60    LIVER FUNCTION TESTS: Recent Labs    01/25/21 1322 01/27/21 1809 01/29/21 1636  BILITOT 0.3 0.3 0.5  AST 12* 12* 17  ALT 14 12 15   ALKPHOS 60 55 54  PROT 9.3* 9.2* 8.9*  ALBUMIN 3.4* 3.2* 2.9*    Assessment and Plan: Pt lying in bed talking on cell phone She states she feels better today that yesterday.  She only has pain when flushing proximal RLQ drain. She has no complaints.  Promixal RLQ drain: pt has pain when flushing, aspirated bright red blood-scant ant OP in JP with 33 cc OP in 24 hours. Insertion site unremarkable with sutures/ statlock in place. Dressing C/D/I Distal RLQ drain: scant amt serosanguinous fluid in JP, flushes easily. 27 cc OP in 24 hours. Insertion site unremarkable with sutures/stat lock in place.Dressing C/D/I Midline drain: scant amt yellow fluid in gravity bag with 117 cc documented in Epic. Flushes easily. Insertion site unremakable with sutures/statlock in place. Dressing C/D/I.  LLQ drain: scant amt yellowish fluid to gravity bag with 17 cc documented OP in 24 hours. Flushed easily. Insertion site unremarkable with sutures/statlock in place. Dressing C/D/I.  WBC 9.1 No organisms seen on culture Pt afebrile. VSS  Continue to document OP in Epic. Flush q shift.  Change dressing q shift or as needed.  IR to follow.  Call IR with questions or concerns.   Electronically Signed: 01/29/21, NP 02/04/2021, 3:27  PM   I spent a total of 15 Minutes at the the patient's bedside AND on the patient's hospital floor or unit, greater than 50% of which was counseling/coordinating care for abdominal/pelvic drains.

## 2021-02-04 NOTE — Care Management (Signed)
CM spoke to RN on unit regarding patient's potenital needs for home  CM spoke to patient and patient did not have preference for home health agency. CM called Jeri Modena with [facility] to make her aware of referral.  She will follow patient and needs. CM reviewed demographics with patient and they are correct in system.  CM made referral to CSW also to see patient for resources.  CM will follow up with patient in am.  Gretchen Short RNC-MNN, BSN Trans[facility]

## 2021-02-04 NOTE — Progress Notes (Signed)
Peripherally Inserted Central Catheter Placement  The IV Nurse has discussed with the patient and/or persons authorized to consent for the patient, the purpose of this procedure and the potential benefits and risks involved with this procedure.  The benefits include less needle sticks, lab draws from the catheter, and the patient may be discharged home with the catheter. Risks include, but not limited to, infection, bleeding, blood clot (thrombus formation), and puncture of an artery; nerve damage and irregular heartbeat and possibility to perform a PICC exchange if needed/ordered by physician.  Alternatives to this procedure were also discussed.  Bard Power PICC patient education guide, fact sheet on infection prevention and patient information card has been provided to patient /or left at bedside.    PICC Placement Documentation  PICC Single Lumen 02/04/21 Right Basilic 40 cm 0 cm (Active)  Indication for Insertion or Continuance of Line Home intravenous therapies (PICC only) 02/04/21 1718  Exposed Catheter (cm) 0 cm 02/04/21 1718  Site Assessment Clean;Dry;Intact 02/04/21 1718  Line Status Flushed;Saline locked;Blood return noted 02/04/21 1718  Dressing Type Transparent;Securing device 02/04/21 1718  Dressing Status Clean;Dry;Intact 02/04/21 1718  Antimicrobial disc in place? Yes 02/04/21 1718  Safety Lock Not Applicable 02/04/21 1718  Dressing Intervention Other (Comment) 02/04/21 1718  Dressing Change Due 02/11/21 02/04/21 1718       Annett Fabian 02/04/2021, 5:19 PM

## 2021-02-04 NOTE — Care Management (Signed)
CM received confirmation from [facility] with [facility] and also with Thea Silversmith with [facility] that they have both accepted the patient for IV infusion and for [facility] nursing.  Gretchen Short RNC-MNN, BSN Trans[facility]

## 2021-02-04 NOTE — Progress Notes (Signed)
Subjective: No new complaints.   Antibiotics:  Anti-infectives (From admission, onward)    Start     Dose/Rate Route Frequency Ordered Stop   02/03/21 1330  cefTRIAXone (ROCEPHIN) 2 g in sodium chloride 0.9 % 100 mL IVPB        2 g 200 mL/hr over 30 Minutes Intravenous Every 24 hours 02/03/21 1223     02/03/21 1315  metroNIDAZOLE (FLAGYL) tablet 500 mg        500 mg Oral Every 12 hours 02/03/21 1223     02/02/21 2000  Ampicillin-Sulbactam (UNASYN) 3 g in sodium chloride 0.9 % 100 mL IVPB  Status:  Discontinued        3 g 200 mL/hr over 30 Minutes Intravenous Every 6 hours 02/02/21 1825 02/03/21 1223   02/02/21 1630  metroNIDAZOLE (FLAGYL) tablet 2,000 mg        2,000 mg Oral  Once 02/02/21 1531 02/02/21 1542   02/01/21 1045  amoxicillin-clavulanate (AUGMENTIN) 875-125 MG per tablet 1 tablet  Status:  Discontinued        1 tablet Oral 2 times daily 02/01/21 0954 02/02/21 1825   02/01/21 1045  doxycycline (VIBRA-TABS) tablet 100 mg        100 mg Oral Every 12 hours 02/01/21 0954     01/31/21 0745  fluconazole (DIFLUCAN) tablet 150 mg        150 mg Oral  Once 01/31/21 0651 01/31/21 0813   01/30/21 0830  cefoTEtan (CEFOTAN) 2 g in sodium chloride 0.9 % 100 mL IVPB  Status:  Discontinued        2 g 200 mL/hr over 30 Minutes Intravenous Every 12 hours 01/30/21 0816 02/01/21 0954   01/29/21 1830  metroNIDAZOLE (FLAGYL) IVPB 500 mg  Status:  Discontinued        500 mg 100 mL/hr over 60 Minutes Intravenous Every 6 hours 01/29/21 1736 01/30/21 0816   01/29/21 1800  cefoTEtan (CEFOTAN) 1 g in sodium chloride 0.9 % 100 mL IVPB  Status:  Discontinued        1 g 200 mL/hr over 30 Minutes Intravenous Every 12 hours 01/29/21 1553 01/30/21 0816   01/29/21 1700  doxycycline (VIBRAMYCIN) 100 mg in sodium chloride 0.9 % 250 mL IVPB  Status:  Discontinued        100 mg 125 mL/hr over 120 Minutes Intravenous Every 12 hours 01/29/21 1553 02/01/21 0954   01/29/21 1630  metroNIDAZOLE  (FLAGYL) IVPB 500 mg  Status:  Discontinued        500 mg 100 mL/hr over 60 Minutes Intravenous Every 6 hours 01/29/21 1553 01/29/21 1736       Medications: Scheduled Meds:  doxycycline  100 mg Oral Q12H   ketorolac  10 mg Oral Q8H   metroNIDAZOLE  500 mg Oral Q12H   prenatal multivitamin  1 tablet Oral Q1200   sodium chloride flush  10 mL Intracatheter Q12H   sodium chloride flush  5 mL Intracatheter Q8H   Continuous Infusions:  sodium chloride Stopped (02/01/21 0959)   cefTRIAXone (ROCEPHIN)  IV 2 g (02/03/21 1305)   PRN Meds:.sodium chloride, acetaminophen, alum & mag hydroxide-simeth, HYDROmorphone (DILAUDID) injection, oxyCODONE-acetaminophen, senna-docusate    Objective: Weight change:   Intake/Output Summary (Last 24 hours) at 02/04/2021 1239 Last data filed at 02/04/2021 0951 Gross per 24 hour  Intake 371.8 ml  Output 67 ml  Net 304.8 ml    Blood pressure 136/78, pulse 85, temperature 98 F (  36.7 C), temperature source Oral, resp. rate 18, height 5\' 4"  (1.626 m), weight 105.7 kg, last menstrual period 01/22/2021, SpO2 97 %. Temp:  [97.9 F (36.6 C)-98.6 F (37 C)] 98 F (36.7 C) (11/01 0729) Pulse Rate:  [80-98] 85 (11/01 0729) Resp:  [18] 18 (11/01 0729) BP: (108-136)/(57-99) 136/78 (11/01 0729) SpO2:  [92 %-100 %] 97 % (11/01 0729)  Physical Exam: General: NAD, nl appearance HE: Normocephalic, atraumatic , EOMI, Conjunctivae normal ENT: No congestion, no rhinorrhea, no exudate or erythema  Pulmonary : Normal work of breathing on room air Abdominal: obese, 4 drains in place. Minimal output in JP drains Musculoskeletal: no swelling , deformity, injury ,or tenderness in extremities, Skin: Warm, dry  Psych: Appropriate, emotional during conversation    CBC: CBC Latest Ref Rng & Units 02/04/2021 02/03/2021 02/02/2021  WBC 4.0 - 10.5 K/uL 9.1 11.8(H) 12.7(H)  Hemoglobin 12.0 - 15.0 g/dL 10.6(L) 11.1(L) 10.5(L)  Hematocrit 36.0 - 46.0 % 32.6(L) 33.9(L)  32.2(L)  Platelets 150 - 400 K/uL 472(H) 333 461(H)     BMET Recent Labs    02/04/21 0738  NA 132*  K 3.2*  CL 96*  CO2 27  GLUCOSE 104*  BUN <5*  CREATININE 0.57  CALCIUM 8.2*    Micro Results: Recent Results (from the past 720 hour(s))  Wet prep, genital     Status: Abnormal   Collection Time: 01/25/21  3:07 PM   Specimen: PATH Cytology Cervicovaginal Ancillary Only  Result Value Ref Range Status   Yeast Wet Prep HPF POC NONE SEEN NONE SEEN Final   Trich, Wet Prep PRESENT (A) NONE SEEN Final   Clue Cells Wet Prep HPF POC PRESENT (A) NONE SEEN Final   WBC, Wet Prep HPF POC MANY (A) NONE SEEN Final   Sperm NONE SEEN  Final    Comment: Performed at [facility] [place], [address] [place], [place] [address]  Aerobic/Anaerobic Culture w Gram Stain (surgical/deep wound)     Status: None   Collection Time: 01/30/21 12:00 PM   Specimen: Abscess  Result Value Ref Range Status   Specimen Description ABSCESS OVARY FALLOPIAN TUBE  Final   Special Requests SPEC 1  Final   Gram Stain   Final    FEW WBC PRESENT,BOTH PMN AND MONONUCLEAR NO ORGANISMS SEEN    Culture   Final    No growth aerobically or anaerobically. Performed at [facility], [address] [place], [place] [address]    Report Status 02/04/2021 FINAL  Final  Aerobic/Anaerobic Culture w Gram Stain (surgical/deep wound)     Status: None   Collection Time: 01/30/21 12:00 PM   Specimen: Abscess  Result Value Ref Range Status   Specimen Description ABSCESS OVARY FALLOPIAN TUBE  Final   Special Requests SPEC 2  Final   Gram Stain   Final    MODERATE WBC PRESENT,BOTH PMN AND MONONUCLEAR NO ORGANISMS SEEN    Culture   Final    No growth aerobically or anaerobically. Performed at [facility], [address] [place], [place] [address]    Report Status 02/04/2021 FINAL  Final  Aerobic/Anaerobic Culture w Gram Stain (surgical/deep wound)     Status: None (Preliminary result)   Collection  Time: 02/03/21 11:24 AM   Specimen: Abscess  Result Value Ref Range Status   Specimen Description ABSCESS  Final   Special Requests DRAIN  Final   Gram Stain NO ORGANISMS SEEN  Final   Culture  Final    NO GROWTH < 24 HOURS Performed at [facility], [place] [address], [place], [place] [address]    Report Status PENDING  Incomplete    Studies/Results: CT ABDOMEN PELVIS W CONTRAST  Result Date: 02/02/2021 CLINICAL DATA:  Intra-abdominal abscess. EXAM: CT ABDOMEN AND PELVIS WITH CONTRAST TECHNIQUE: Multidetector CT imaging of the abdomen and pelvis was performed using the standard protocol following bolus administration of intravenous contrast. CONTRAST:  138mL OMNIPAQUE IOHEXOL 300 MG/ML  SOLN COMPARISON:  CT abdomen pelvis 05/16/2018, CT abdomen pelvis 01/25/2021, MRI abdomen 01/25/2021, CT image guided procedure 01/30/2021 FINDINGS: Lower chest: Bilateral trace pleural effusions. Bilateral lower lobe atelectasis. Hepatobiliary: No focal liver abnormality. Status post cholecystectomy. No biliary dilatation. Pancreas: No focal lesion. Normal pancreatic contour. No surrounding inflammatory changes. No main pancreatic ductal dilatation. Spleen: Normal in size without focal abnormality. Adrenals/Urinary Tract: No adrenal nodule bilaterally. Question interval developing trace delayed left nephrogram with bilateral distal ureters coursing along the pelvic mass. No striated nephrogram No hydronephrosis. No hydroureter. No definite urothelial thickening. No nephroureterolithiasis. The urinary bladder is unremarkable. Stomach/Bowel: PO contrast reaches the large bowel. Stomach is within normal limits. No evidence of bowel wall thickening or dilatation. Appendix appears normal. Vascular/Lymphatic: No abdominal aorta or iliac aneurysm.Multiple retroperitoneal lymph nodes measure at the upper limits of normal. There is an enlarged left periaortic lymph node measuring up to 1.2 cm (4:46). Prominent but  nonenlarged right pelvic wall lymph node (4:75). No abdominal, pelvic, or inguinal lymphadenopathy. Reproductive: Redemonstration of a complex multi-cystic pelvic mass measuring up to least 15.8 x 8.5 x 10.8 cm. Interval decrease in size of the superior most largest in inferior-most largest component. The lesion is noted to be inseparable from the fundus of the uterus. The bilateral ovaries are not definitely identified. Other: Interval placement of two pelvic pigtail catheters terminating within the known complex multicystic mass lesion. Trace free fluid within the pelvis. No free gas. Musculoskeletal: No abdominal wall hernia or abnormality. No suspicious lytic or blastic osseous lesions. No acute displaced fracture. Multilevel degenerative changes of the spine. IMPRESSION: 1. Interval placement of two pelvic catheters terminating within a known 15.8 x 8.5 x 3 8 cm complex multicystic pelvic mass lesion with slight interval decrease in size of the two largest component. As stated on MRI abdomen 01/25/2021, this could represent a tubo-ovarian abscess versus neoplasm versus endometriosis. 2. Multiple retroperitoneal borderline enlarged and prominent lymph nodes. Differential diagnosis includes reactive changes versus pathology. 3. Question interval developing trace delayed left nephrogram with bilateral distal ureters coursing along the pelvic mass. No definite hydronephrosis or urothelial thickening. 4. Trace simple free fluid pelvic ascites. Electronically Signed   By: Iven Finn M.D.   On: 02/02/2021 16:27   CT IMAGE GUIDED FLUID DRAIN BY CATHETER  Result Date: 02/03/2021 INDICATION: Complex tubo-ovarian abscesses, post CT-guided percutaneous drainage catheter placement x2 on 01/30/2021. Subsequent abdominal CT performed 02/02/2021 demonstrates several undrained adnexal collections and as such patient presents again for repeat image guided drainage catheter placement. EXAM: CT-GUIDED ADNEXAL DRAINAGE  CATHETER PLACEMENT X2 COMPARISON:  CT abdomen pelvis-02/02/2021; 05/16/2018 CT-guided percutaneous catheter placement x2-01/30/2021 Pelvic MRI-01/25/2021 MEDICATIONS: The patient is currently admitted to the hospital and receiving intravenous antibiotics. The antibiotics were administered within an appropriate time frame prior to the initiation of the procedure. ANESTHESIA/SEDATION: Moderate (conscious) sedation was employed during this procedure. A total of Versed 3 mg and Fentanyl 150 mcg was administered intravenously. Moderate Sedation Time: 42 minutes. The patient's level of consciousness  and vital signs were monitored continuously by radiology nursing throughout the procedure under my direct supervision. CONTRAST:  None COMPLICATIONS: None immediate. PROCEDURE: Informed written consent was obtained from the patient after a discussion of the risks, benefits and alternatives to treatment. The patient was placed supine on the CT gantry and a pre procedural CT was performed re-demonstrating the known ill-defined complex abscesses within the adnexa bilaterally with dominant ill-defined collection within the right adnexa measuring approximately 5.1 x 4.3 cm (image 30, series 2 and multiloculated collection along the midline of the lower abdomen/pelvis with right lateral component measuring approximately 6.3 x 4.3 cm (image 22, series 2, dominant collection within the midline of the abdomen measuring approximately 7.8 x 5.6 cm (image 13, series 2 and collection within the left side of the pelvis measuring approximately 3.6 x 3.4 cm (image 20, series 2). The CT gantry table position was marked and the ill-defined collections were identified sonographically. The procedure was planned. A timeout was performed prior to the initiation of the procedure. The skin overlying the ventral aspect the lower abdomen/pelvis was prepped and draped in the usual sterile fashion. The overlying soft tissues were anesthetized with 1%  lidocaine with epinephrine. Under direct ultrasound guidance, the dominant collection within the right adnexa was targeted with an 18 gauge trocar needle allowing placement of a short Amplatz wire. Multiple ultrasound images were saved for procedural documentation purposes Next, under direct ultrasound guidance, the adjacent collections within the more midline of the lower abdomen/pelvis was targeted utilizing a right to left approach. This allowed for placement of a regular length Amplatz wire. Multiple ultrasound images were saved procedural documentation purposes. Appropriate position was confirmed with CT imaging. Both tracks were dilated ultimately allowing placement of a 12 French percutaneous drainage catheter within the dominant collection within right pelvis as well as a 12 [place] biliary type multi side-hole drainage catheter traversing multiple adjacent adnexal collections. Approximately 10 cc of slightly blood-tinged serous fluid was aspirated from the dominant collection within the right pelvis. A total of approximately 200 cc of slightly blood-tinged serous fluid was aspirated from all the adjacent ill-defined collections within the more midline of the adnexa as the drainage catheter was slowly retracted ultimately with radiopaque side marker positioned adjacent to the entrance site of the accessed adnexal collection. A representative sample was capped and sent to the laboratory for analysis. Completion images were obtained (series 7), and the procedure was terminated. Both drainage catheters were secured at the skin entrance sites with interrupted sutures and both drainage catheters were connected to JP bulbs. Dressings were applied. The patient tolerated the procedure well without immediate post procedural complication. IMPRESSION: 1. Successful ultrasound and CT guided placement of a 12 French all purpose drain catheter into the dominant collection within the right pelvis yielding 10 cc of blood  tinged serous fluid. 2. Successful ultrasound and CT-guided placement of a 12 French multi side-hole biliary type drainage catheter traversing several adjacent adnexal collections yielding a total of 200 cc of blood tinged serous fluid. A representative sample of aspirated fluid was capped and sent to the laboratory for analysis. Electronically Signed   By: Simonne Come M.D.   On: 02/03/2021 13:26   CT IMAGE GUIDED DRAINAGE BY PERCUTANEOUS CATHETER  Result Date: 02/03/2021 INDICATION: Complex tubo-ovarian abscesses, post CT-guided percutaneous drainage catheter placement x2 on 01/30/2021. Subsequent abdominal CT performed 02/02/2021 demonstrates several undrained adnexal collections and as such patient presents again for repeat image guided drainage catheter placement. EXAM: CT-GUIDED ADNEXAL  DRAINAGE CATHETER PLACEMENT X2 COMPARISON:  CT abdomen pelvis-02/02/2021; 05/16/2018 CT-guided percutaneous catheter placement x2-01/30/2021 Pelvic MRI-01/25/2021 MEDICATIONS: The patient is currently admitted to the hospital and receiving intravenous antibiotics. The antibiotics were administered within an appropriate time frame prior to the initiation of the procedure. ANESTHESIA/SEDATION: Moderate (conscious) sedation was employed during this procedure. A total of Versed 3 mg and Fentanyl 150 mcg was administered intravenously. Moderate Sedation Time: 42 minutes. The patient's level of consciousness and vital signs were monitored continuously by radiology nursing throughout the procedure under my direct supervision. CONTRAST:  None COMPLICATIONS: None immediate. PROCEDURE: Informed written consent was obtained from the patient after a discussion of the risks, benefits and alternatives to treatment. The patient was placed supine on the CT gantry and a pre procedural CT was performed re-demonstrating the known ill-defined complex abscesses within the adnexa bilaterally with dominant ill-defined collection within the right  adnexa measuring approximately 5.1 x 4.3 cm (image 30, series 2 and multiloculated collection along the midline of the lower abdomen/pelvis with right lateral component measuring approximately 6.3 x 4.3 cm (image 22, series 2, dominant collection within the midline of the abdomen measuring approximately 7.8 x 5.6 cm (image 13, series 2 and collection within the left side of the pelvis measuring approximately 3.6 x 3.4 cm (image 20, series 2). The CT gantry table position was marked and the ill-defined collections were identified sonographically. The procedure was planned. A timeout was performed prior to the initiation of the procedure. The skin overlying the ventral aspect the lower abdomen/pelvis was prepped and draped in the usual sterile fashion. The overlying soft tissues were anesthetized with 1% lidocaine with epinephrine. Under direct ultrasound guidance, the dominant collection within the right adnexa was targeted with an 18 gauge trocar needle allowing placement of a short Amplatz wire. Multiple ultrasound images were saved for procedural documentation purposes Next, under direct ultrasound guidance, the adjacent collections within the more midline of the lower abdomen/pelvis was targeted utilizing a right to left approach. This allowed for placement of a regular length Amplatz wire. Multiple ultrasound images were saved procedural documentation purposes. Appropriate position was confirmed with CT imaging. Both tracks were dilated ultimately allowing placement of a 12 French percutaneous drainage catheter within the dominant collection within right pelvis as well as a 12 [place] biliary type multi side-hole drainage catheter traversing multiple adjacent adnexal collections. Approximately 10 cc of slightly blood-tinged serous fluid was aspirated from the dominant collection within the right pelvis. A total of approximately 200 cc of slightly blood-tinged serous fluid was aspirated from all the adjacent  ill-defined collections within the more midline of the adnexa as the drainage catheter was slowly retracted ultimately with radiopaque side marker positioned adjacent to the entrance site of the accessed adnexal collection. A representative sample was capped and sent to the laboratory for analysis. Completion images were obtained (series 7), and the procedure was terminated. Both drainage catheters were secured at the skin entrance sites with interrupted sutures and both drainage catheters were connected to JP bulbs. Dressings were applied. The patient tolerated the procedure well without immediate post procedural complication. IMPRESSION: 1. Successful ultrasound and CT guided placement of a 12 French all purpose drain catheter into the dominant collection within the right pelvis yielding 10 cc of blood tinged serous fluid. 2. Successful ultrasound and CT-guided placement of a 12 French multi side-hole biliary type drainage catheter traversing several adjacent adnexal collections yielding a total of 200 cc of blood tinged serous fluid. A representative sample  of aspirated fluid was capped and sent to the laboratory for analysis. Electronically Signed   By: Simonne Come M.D.   On: 02/03/2021 13:26      Assessment/Plan:  INTERVAL HISTORY: IR placed 2 drains on 10/31 and sent material for cultures, negative at 24 hours   Principal Problem:   TOA (tubo-ovarian abscess) Active Problems:   Trichomoniasis   BMI 40.0-44.9, adult (HCC)   Hypertension   Abdominal fluid collection   Abscess   Intra-abdominal abscess (HCC)    Christy Schaefer is a 40 y.o. female with multiple tubo-ovarian abscesses (vs malignancy) sp drains placed with culture not yielding an organism, fevering through augmentin, doxycycline.  Trichomonas was positive from pelvic exam.  #1 Tubo-ovarian abscesses (vs malignancy)  Four drains in place. Repeat cultures taken are negative at 24 hours.   Continued on ceftriaxone with  metronidazole today. Stopped Doxycycline.   Appears stable today , discussed a plan for parental antibiotic with IV Ceftriaxone and continuing metronidazole. Currently working on arranging [facility]. If patient qualifies then PICC line can be placed for anticipated 4 week course of IV antibiotics. Total course of antibiotic regimen will depend on resolution of abscess. Will follow IR's management of drains for abscess.    #2 HIV testing: HIV ab negative RNA pending  She would benefit from being on PrEP, preferably [place] Apretude though could consider Descovy for PrEP in meantime  Educated patient on options above. She would like to follow up on consideration of PrEP in ID Clinic at discharge.   Thurmon Fair, MD PGY3 Internal Medicine [phone]

## 2021-02-04 NOTE — Plan of Care (Signed)
  Problem: Education: Goal: Knowledge of General Education information will improve Description: Including pain rating scale, medication(s)/side effects and non-pharmacologic comfort measures Outcome: Progressing   Problem: Health Behavior/Discharge Planning: Goal: Ability to manage health-related needs will improve Outcome: Progressing   Problem: Clinical Measurements: Goal: Ability to maintain clinical measurements within normal limits will improve Outcome: Progressing Goal: Will remain free from infection Outcome: Progressing Goal: Diagnostic test results will improve Outcome: Progressing Goal: Respiratory complications will improve Outcome: Progressing Goal: Cardiovascular complication will be avoided Outcome: Progressing   Problem: Activity: Goal: Risk for activity intolerance will decrease Outcome: Progressing   Problem: Elimination: Goal: Will not experience complications related to bowel motility Outcome: Progressing   Problem: Pain Managment: Goal: General experience of comfort will improve Outcome: Progressing   Problem: Safety: Goal: Ability to remain free from injury will improve Outcome: Progressing   Problem: Skin Integrity: Goal: Risk for impaired skin integrity will decrease Outcome: Progressing   

## 2021-02-04 NOTE — Progress Notes (Signed)
Gynecology Progress Note  Admission Date: 01/29/2021 Current Date: 02/04/2021 7:23 AM  Christy Schaefer is a 40 y.o. HD#7/POD#1 and POD4 admitted for large pelvic cysts/fluid collections with presumed TOAs s/p IR drainage x2.    History complicated by: Patient Active Problem List   Diagnosis Date Noted   BMI 40.0-44.9, adult (HCC) 02/03/2021   Hypertension 02/03/2021   Abdominal fluid collection    Abscess    Intra-abdominal abscess (HCC)    Trichomoniasis 02/02/2021   TOA (tubo-ovarian abscess) 01/29/2021    ROS and patient/family/surgical history, located on admission H&P note dated 01/29/2021, have been reviewed, and there are no changes except as noted below Yesterday/Overnight Events:  None  Subjective:  Pain is improved compared to yesterday. No N/V. She was able to sleep comfortably.   Objective:    Current Vital Signs 24h Vital Sign Ranges  T 98.2 F (36.8 C) Temp  Avg: 98.1 F (36.7 C)  Min: 97.7 F (36.5 C)  Max: 98.6 F (37 C)  BP (!) 122/57  BP  Min: 108/84  Max: 145/90  HR 92 Pulse  Avg: 88.9  Min: 80  Max: 98  RR 18 Resp  Avg: 15.8  Min: 12  Max: 18  SaO2 98 % Room Air SpO2  Avg: 96.4 %  Min: 92 %  Max: 100 %       24 Hour I/O Current Shift I/O  Time Ins Outs 10/31 0701 - 11/01 0700 In: 371.8 [I.V.:25.5] Out: 90 [Drains:90] No intake/output data recorded.  (4 drains in place)   Patient Vitals for the past 24 hrs:  BP Temp Temp src Pulse Resp SpO2  02/04/21 0417 (!) 122/57 98.2 F (36.8 C) Oral 92 18 98 %  02/03/21 2254 (!) 127/99 98.2 F (36.8 C) Oral 98 18 97 %  02/03/21 1921 110/83 98.2 F (36.8 C) Oral 87 18 94 %  02/03/21 1630 108/84 98.6 F (37 C) Oral 94 18 93 %  02/03/21 1348 125/75 97.9 F (36.6 C) Oral 89 18 93 %  02/03/21 1310 111/70 97.9 F (36.6 C) Oral 80 18 100 %  02/03/21 1309 -- -- -- -- -- 92 %  02/03/21 1215 136/86 97.7 F (36.5 C) Oral 93 17 100 %  02/03/21 1150 129/81 97.7 F (36.5 C) Oral 87 -- 94 %  02/03/21 1125 131/86  -- -- 94 18 94 %  02/03/21 1110 119/89 -- -- 90 12 98 %  02/03/21 1105 (!) 119/92 -- -- 90 14 98 %  02/03/21 1100 (!) 130/97 -- -- 92 14 98 %  02/03/21 1055 123/90 -- -- 88 13 97 %  02/03/21 1050 116/88 -- -- 90 13 98 %  02/03/21 1045 125/83 -- -- 88 17 97 %  02/03/21 1040 (!) 112/91 -- -- 90 16 99 %  02/03/21 1035 138/88 -- -- 86 14 96 %  02/03/21 1030 (!) 123/92 -- -- 84 12 98 %  02/03/21 1020 (!) 124/95 -- -- 84 15 98 %  02/03/21 0953 (!) 124/91 -- -- 86 14 95 %  02/03/21 0731 (!) 145/90 98.1 F (36.7 C) Oral 84 18 94 %    Physical exam: General appearance: alert, cooperative, and appears stated age Abdomen:  obese, soft, nttp, nd. four IR drains in place with drainage - one with purulent, one with serosanguinous and two with minimal fluid Lungs: clear to auscultation bilaterally Heart: S1, S2 normal, no murmur, rub or gallop, regular rate and rhythm Extremities: no lower extremity edema  Skin: warm and dry Psych: appropriate Neurologic: Grossly normal  Medications Current Facility-Administered Medications  Medication Dose Route Frequency Provider Last Rate Last Admin   0.9 %  sodium chloride infusion   Intravenous PRN Federico Flake, MD   Stopped at 02/01/21 0959   acetaminophen (TYLENOL) tablet 650 mg  650 mg Oral Q4H PRN Adam Phenix, MD   650 mg at 02/02/21 1542   alum & mag hydroxide-simeth (MAALOX/MYLANTA) 200-200-20 MG/5ML suspension 15 mL  15 mL Oral Q4H PRN Hermina Staggers, MD       cefTRIAXone (ROCEPHIN) 2 g in sodium chloride 0.9 % 100 mL IVPB  2 g Intravenous Q24H Daiva Eves, Lisette Grinder, MD 200 mL/hr at 02/03/21 1305 2 g at 02/03/21 1305   doxycycline (VIBRA-TABS) tablet 100 mg  100 mg Oral Q12H Adam Phenix, MD   100 mg at 02/03/21 2118   HYDROmorphone (DILAUDID) injection 1 mg  1 mg Intravenous Q4H PRN Hermina Staggers, MD   1 mg at 02/04/21 0419   metroNIDAZOLE (FLAGYL) tablet 500 mg  500 mg Oral Q12H Daiva Eves, Lisette Grinder, MD   500 mg at 02/03/21 2118    oxyCODONE-acetaminophen (PERCOCET/ROXICET) 5-325 MG per tablet 1-2 tablet  1-2 tablet Oral Q3H PRN Federico Flake, MD   1 tablet at 02/03/21 1637   prenatal multivitamin tablet 1 tablet  1 tablet Oral Q1200 Federico Flake, MD   1 tablet at 02/02/21 1321   senna-docusate (Senokot-S) tablet 1 tablet  1 tablet Oral QHS PRN Federico Flake, MD       sodium chloride flush (NS) 0.9 % injection 10 mL  10 mL Intracatheter Q12H Mugweru, Jon, MD   10 mL at 02/03/21 2208   sodium chloride flush (NS) 0.9 % injection 5 mL  5 mL Intracatheter Sherron Ales, MD   5 mL at 02/04/21 0510      Labs  Pending: Bcx from 10/31 -- NGx1d  Recent Labs  Lab 01/31/21 0708 02/02/21 0916 02/03/21 0458  WBC 14.5* 12.7* 11.8*  HGB 10.7* 10.5* 11.1*  HCT 33.7* 32.2* 33.9*  PLT 517* 461* 333    Recent Labs  Lab 01/29/21 1636  NA 138  K 3.1*  CL 102  CO2 25  BUN 6  CREATININE 0.69  CALCIUM 9.0  PROT 8.9*  BILITOT 0.5  ALKPHOS 54  ALT 15  AST 17  GLUCOSE 88    Radiology Narrative & Impression  CLINICAL DATA:  Intra-abdominal abscess.   EXAM: CT ABDOMEN AND PELVIS WITH CONTRAST   TECHNIQUE: Multidetector CT imaging of the abdomen and pelvis was performed using the standard protocol following bolus administration of intravenous contrast.   CONTRAST:  OMNIPAQUE IOHEXOL 300 MG/ML  SOLN   COMPARISON:  CT abdomen pelvis 05/16/2018, CT abdomen pelvis 01/25/2021, MRI abdomen 01/25/2021, CT image guided procedure 01/30/2021   FINDINGS: Lower chest: Bilateral trace pleural effusions. Bilateral lower lobe atelectasis.   Hepatobiliary: No focal liver abnormality. Status post cholecystectomy. No biliary dilatation.   Pancreas: No focal lesion. Normal pancreatic contour. No surrounding inflammatory changes. No main pancreatic ductal dilatation.   Spleen: Normal in size without focal abnormality.   Adrenals/Urinary Tract:   No adrenal nodule bilaterally.    Question interval developing trace delayed left nephrogram with bilateral distal ureters coursing along the pelvic mass. No striated nephrogram   No hydronephrosis. No hydroureter. No definite urothelial thickening. No nephroureterolithiasis.   The urinary bladder is unremarkable.   Stomach/Bowel:  PO contrast reaches the large bowel. Stomach is within normal limits. No evidence of bowel wall thickening or dilatation. Appendix appears normal.   Vascular/Lymphatic: No abdominal aorta or iliac aneurysm.Multiple retroperitoneal lymph nodes measure at the upper limits of normal. There is an enlarged left periaortic lymph node measuring up to 1.2 cm (4:46). Prominent but nonenlarged right pelvic wall lymph node (4:75). No abdominal, pelvic, or inguinal lymphadenopathy.   Reproductive: Redemonstration of a complex multi-cystic pelvic mass measuring up to least 15.8 x 8.5 x 10.8 cm. Interval decrease in size of the superior most largest in inferior-most largest component. The lesion is noted to be inseparable from the fundus of the uterus. The bilateral ovaries are not definitely identified.   Other: Interval placement of two pelvic pigtail catheters terminating within the known complex multicystic mass lesion. Trace free fluid within the pelvis. No free gas.   Musculoskeletal:   No abdominal wall hernia or abnormality.   No suspicious lytic or blastic osseous lesions. No acute displaced fracture. Multilevel degenerative changes of the spine.   IMPRESSION: 1. Interval placement of two pelvic catheters terminating within a known 15.8 x 8.5 x 3 8 cm complex multicystic pelvic mass lesion with slight interval decrease in size of the two largest component. As stated on MRI abdomen 01/25/2021, this could represent a tubo-ovarian abscess versus neoplasm versus endometriosis. 2. Multiple retroperitoneal borderline enlarged and prominent lymph nodes. Differential diagnosis includes  reactive changes versus pathology. 3. Question interval developing trace delayed left nephrogram with bilateral distal ureters coursing along the pelvic mass. No definite hydronephrosis or urothelial thickening. 4. Trace simple free fluid pelvic ascites.     Electronically Signed   By: Tish Frederickson M.D.   On: 02/02/2021 16:27    Assessment & Plan:  Pt stable *GYN: s/p IR drainage again now with 2 additional drains (total of 4).   Current ABX: Ceftriaxone and Flagyl. S/p cefotan x 2 d on admit. S/p augmentin x 2d. S/p doxy/unasyn.  ID following appreciate recs  *Pain: controlled on PO PRNs *HTN: no issues on no meds *FEN/GI: Regular diet tolerated *PPx: holding all anti-coagulation *Dispo: potentially tomorrow   Code Status: Full Code  Total time taking care of the patient was 25 minutes, with greater than 50% of the time spent in face to face interaction with the patient.  Milas Hock, MD Attending Center for [facility]) GYN Consult Phone: [phone] (M-F, 0800-1700) & [phone] (Off hours, weekends, holidays)

## 2021-02-04 NOTE — Progress Notes (Signed)
Patient called RN in there room around 0400 to assess JP drain 4. The patient stated that she got up to use the restroom, and while getting up she accidentally pulled on the drain. She said that this incident was causing pain of a 7/10. RN removed previous dressing to make sure the site was intact. When removing dressing, RN saw that there was a very small amount of blood on the gauze around the site. The site was not actively bleeding and was still intact from placement. The stitch was also intact. RN performed dressing change on the site and instructed patient to notify if site starts actively bleeding. RN also gave IV  PRN pain medicine and instructed patient to call out if pain is unresolved or increases.

## 2021-02-05 ENCOUNTER — Other Ambulatory Visit (HOSPITAL_COMMUNITY): Payer: Self-pay

## 2021-02-05 DIAGNOSIS — I158 Other secondary hypertension: Secondary | ICD-10-CM

## 2021-02-05 LAB — CBC WITH DIFFERENTIAL/PLATELET
Abs Immature Granulocytes: 0.1 10*3/uL — ABNORMAL HIGH (ref 0.00–0.07)
Basophils Absolute: 0.1 10*3/uL (ref 0.0–0.1)
Basophils Relative: 1 %
Eosinophils Absolute: 0.3 10*3/uL (ref 0.0–0.5)
Eosinophils Relative: 3 %
HCT: 32 % — ABNORMAL LOW (ref 36.0–46.0)
Hemoglobin: 10.3 g/dL — ABNORMAL LOW (ref 12.0–15.0)
Lymphocytes Relative: 27 %
Lymphs Abs: 2.6 10*3/uL (ref 0.7–4.0)
MCH: 28.6 pg (ref 26.0–34.0)
MCHC: 32.2 g/dL (ref 30.0–36.0)
MCV: 88.9 fL (ref 80.0–100.0)
Monocytes Absolute: 0.4 10*3/uL (ref 0.1–1.0)
Monocytes Relative: 4 %
Myelocytes: 1 %
Neutro Abs: 6.3 10*3/uL (ref 1.7–7.7)
Neutrophils Relative %: 64 %
Platelets: 419 10*3/uL — ABNORMAL HIGH (ref 150–400)
RBC: 3.6 MIL/uL — ABNORMAL LOW (ref 3.87–5.11)
RDW: 13.2 % (ref 11.5–15.5)
WBC: 9.8 10*3/uL (ref 4.0–10.5)
nRBC: 0 % (ref 0.0–0.2)
nRBC: 0 /100 WBC

## 2021-02-05 MED ORDER — CEFTRIAXONE IV (FOR PTA / DISCHARGE USE ONLY): 2.0000 g | INTRAVENOUS | 0 refills | Status: DC

## 2021-02-05 MED ORDER — HEPARIN SOD (PORK) LOCK FLUSH 100 UNIT/ML IV SOLN
250.0000 [IU] | INTRAVENOUS | Status: AC | PRN
  Administered 2021-02-05: 250 [IU]
  Filled 2021-02-05: qty 2.5

## 2021-02-05 MED ORDER — ACETAMINOPHEN 325 MG PO TABS
650.0000 mg | ORAL_TABLET | ORAL | 1 refills | Status: AC | PRN
  Filled 2021-02-05: qty 60, 5d supply, fill #0

## 2021-02-05 MED ORDER — DOCUSATE SODIUM 100 MG PO CAPS
100.0000 mg | ORAL_CAPSULE | Freq: Two times a day (BID) | ORAL | 0 refills | Status: AC
  Filled 2021-02-05: qty 60, 30d supply, fill #0

## 2021-02-05 MED ORDER — OXYCODONE HCL 5 MG PO TABS
5.0000 mg | ORAL_TABLET | Freq: Four times a day (QID) | ORAL | 0 refills | Status: AC | PRN
  Filled 2021-02-05: qty 30, 4d supply, fill #0

## 2021-02-05 MED ORDER — IBUPROFEN 600 MG PO TABS
600.0000 mg | ORAL_TABLET | Freq: Four times a day (QID) | ORAL | 0 refills | Status: AC | PRN
  Filled 2021-02-05: qty 60, 15d supply, fill #0

## 2021-02-05 MED ORDER — METRONIDAZOLE 500 MG PO TABS
500.0000 mg | ORAL_TABLET | Freq: Three times a day (TID) | ORAL | 0 refills | Status: AC
  Filled 2021-02-05: qty 81, 27d supply, fill #0

## 2021-02-05 NOTE — Progress Notes (Signed)
CSW followed up with patient at bedside to assist with transportation and resources. CSW introduced self and explained role. Patient was pleasant and reported needing assistance with transportation for follow up appointments. Patient confirmed that she has utilized [employer] transportation in the past and is agreeable to utilizing it for future appointments. CSW obtained patient's signature on rider waiver and release of liability form and agreed to set up transportation for appointments scheduled, patient agreeable and confirmed address and telephone number. Patient reported that she is also interested in food stamps, CSW provided patient with the link to apply for food stamps. Patient reported that she needed a note for work, CSW agreed to follow up with attending. Patient inquired about assistance with short term disability paperwork, CSW encouraged patient to follow up with her provider at her follow up appointment for assistance. Patient reported that her nephew will provide transportation home at discharge.   CSW followed up with attending provider who agreed to write patient a note for work.   CSW contacted [facility] transportation and arranged transportation for [facility] Follow up on 02/13/2021 and [facility] appointment on 02/21/2021. [place] transportation staff reported that they are only able to schedule appointments two weeks in advance and that patient would need to call back the week of the 18th to schedule additional appointments. CSW agreed to fax signed rider waiver form.  CSW updated patient about work note and arranged transportation. CSW provided patient with contact information for [facility] transportation and informed patient to call to set up additional transportation for other appointments, patient agreed. CSW inquired about any additional needs/concerns, patient reported none.   Celso Sickle,  LCSW Clinical Social Worker [facility] Cell#: [phone]

## 2021-02-05 NOTE — Progress Notes (Signed)
Subjective: PICC line placed yesterday. No new concerns this morning.    Antibiotics:  Anti-infectives (From admission, onward)    Start     Dose/Rate Route Frequency Ordered Stop   02/05/21 0000  cefTRIAXone (ROCEPHIN) IVPB        2 g Intravenous Every 24 hours 02/05/21 0946 03/04/21 2359   02/05/21 0000  metroNIDAZOLE (FLAGYL) 500 MG tablet        500 mg Oral 3 times daily 02/05/21 0946 03/04/21 2359   02/03/21 1330  cefTRIAXone (ROCEPHIN) 2 g in sodium chloride 0.9 % 100 mL IVPB        2 g 200 mL/hr over 30 Minutes Intravenous Every 24 hours 02/03/21 1223     02/03/21 1315  metroNIDAZOLE (FLAGYL) tablet 500 mg        500 mg Oral Every 12 hours 02/03/21 1223     02/02/21 2000  Ampicillin-Sulbactam (UNASYN) 3 g in sodium chloride 0.9 % 100 mL IVPB  Status:  Discontinued        3 g 200 mL/hr over 30 Minutes Intravenous Every 6 hours 02/02/21 1825 02/03/21 1223   02/02/21 1630  metroNIDAZOLE (FLAGYL) tablet 2,000 mg        2,000 mg Oral  Once 02/02/21 1531 02/02/21 1542   02/01/21 1045  amoxicillin-clavulanate (AUGMENTIN) 875-125 MG per tablet 1 tablet  Status:  Discontinued        1 tablet Oral 2 times daily 02/01/21 0954 02/02/21 1825   02/01/21 1045  doxycycline (VIBRA-TABS) tablet 100 mg  Status:  Discontinued        100 mg Oral Every 12 hours 02/01/21 0954 02/04/21 1249   01/31/21 0745  fluconazole (DIFLUCAN) tablet 150 mg        150 mg Oral  Once 01/31/21 0651 01/31/21 0813   01/30/21 0830  cefoTEtan (CEFOTAN) 2 g in sodium chloride 0.9 % 100 mL IVPB  Status:  Discontinued        2 g 200 mL/hr over 30 Minutes Intravenous Every 12 hours 01/30/21 0816 02/01/21 0954   01/29/21 1830  metroNIDAZOLE (FLAGYL) IVPB 500 mg  Status:  Discontinued        500 mg 100 mL/hr over 60 Minutes Intravenous Every 6 hours 01/29/21 1736 01/30/21 0816   01/29/21 1800  cefoTEtan (CEFOTAN) 1 g in sodium chloride 0.9 % 100 mL IVPB  Status:  Discontinued        1 g 200 mL/hr over 30  Minutes Intravenous Every 12 hours 01/29/21 1553 01/30/21 0816   01/29/21 1700  doxycycline (VIBRAMYCIN) 100 mg in sodium chloride 0.9 % 250 mL IVPB  Status:  Discontinued        100 mg 125 mL/hr over 120 Minutes Intravenous Every 12 hours 01/29/21 1553 02/01/21 0954   01/29/21 1630  metroNIDAZOLE (FLAGYL) IVPB 500 mg  Status:  Discontinued        500 mg 100 mL/hr over 60 Minutes Intravenous Every 6 hours 01/29/21 1553 01/29/21 1736       Medications: Scheduled Meds:  Chlorhexidine Gluconate Cloth  6 each Topical Daily   ketorolac  10 mg Oral Q8H   metroNIDAZOLE  500 mg Oral Q12H   prenatal multivitamin  1 tablet Oral Q1200   sodium chloride flush  10 mL Intracatheter Q12H   sodium chloride flush  10-40 mL Intracatheter Q12H   sodium chloride flush  5 mL Intracatheter Q8H   Continuous Infusions:  sodium chloride Stopped (02/01/21  0959)   cefTRIAXone (ROCEPHIN)  IV 2 g (02/04/21 1331)   PRN Meds:.sodium chloride, acetaminophen, alum & mag hydroxide-simeth, HYDROmorphone (DILAUDID) injection, oxyCODONE-acetaminophen, senna-docusate, sodium chloride flush    Objective: Weight change:   Intake/Output Summary (Last 24 hours) at 02/05/2021 0954 Last data filed at 02/05/2021 0534 Gross per 24 hour  Intake 20 ml  Output 62 ml  Net -42 ml    Blood pressure 127/80, pulse 84, temperature 98 F (36.7 C), temperature source Oral, resp. rate 18, height 5\' 4"  (1.626 m), weight 105.7 kg, last menstrual period 01/22/2021, SpO2 98 %. Temp:  [98 F (36.7 C)-98.1 F (36.7 C)] 98 F (36.7 C) (11/02 0535) Pulse Rate:  [79-88] 84 (11/02 0535) Resp:  [18] 18 (11/02 0535) BP: (127-142)/(78-86) 127/80 (11/02 0535) SpO2:  [98 %-99 %] 98 % (11/02 0534)  Physical Exam: General: NAD, nl appearance HE: Normocephalic, atraumatic , EOMI, Conjunctivae normal ENT: No congestion, no rhinorrhea, no exudate or erythema  Pulmonary : Normal work of breathing on room air Abdominal: obese, 4 drains in  place. Reports mild tenderness to palpation in RLQ. JP drains emptied just before exam Musculoskeletal: no swelling , deformity, injury ,or tenderness in extremities,      CBC: CBC Latest Ref Rng & Units 02/05/2021 02/04/2021 02/03/2021  WBC 4.0 - 10.5 K/uL 9.8 9.1 11.8(H)  Hemoglobin 12.0 - 15.0 g/dL 10.3(L) 10.6(L) 11.1(L)  Hematocrit 36.0 - 46.0 % 32.0(L) 32.6(L) 33.9(L)  Platelets 150 - 400 K/uL 419(H) 472(H) 333     BMET Recent Labs    02/04/21 0738  NA 132*  K 3.2*  CL 96*  CO2 27  GLUCOSE 104*  BUN <5*  CREATININE 0.57  CALCIUM 8.2*     Micro Results: Recent Results (from the past 720 hour(s))  Wet prep, genital     Status: Abnormal   Collection Time: 01/25/21  3:07 PM   Specimen: PATH Cytology Cervicovaginal Ancillary Only  Result Value Ref Range Status   Yeast Wet Prep HPF POC NONE SEEN NONE SEEN Final   Trich, Wet Prep PRESENT (A) NONE SEEN Final   Clue Cells Wet Prep HPF POC PRESENT (A) NONE SEEN Final   WBC, Wet Prep HPF POC MANY (A) NONE SEEN Final   Sperm NONE SEEN  Final    Comment: Performed at [facility], [address] [place], [place] [address]  Aerobic/Anaerobic Culture w Gram Stain (surgical/deep wound)     Status: None   Collection Time: 01/30/21 12:00 PM   Specimen: Abscess  Result Value Ref Range Status   Specimen Description ABSCESS OVARY FALLOPIAN TUBE  Final   Special Requests SPEC 1  Final   Gram Stain   Final    FEW WBC PRESENT,BOTH PMN AND MONONUCLEAR NO ORGANISMS SEEN    Culture   Final    No growth aerobically or anaerobically. Performed at [facility], [place] [address], [place], [place] [address]    Report Status 02/04/2021 FINAL  Final  Aerobic/Anaerobic Culture w Gram Stain (surgical/deep wound)     Status: None   Collection Time: 01/30/21 12:00 PM   Specimen: Abscess  Result Value Ref Range Status   Specimen Description ABSCESS OVARY FALLOPIAN TUBE  Final   Special Requests SPEC 2  Final   Gram Stain    Final    MODERATE WBC PRESENT,BOTH PMN AND MONONUCLEAR NO ORGANISMS SEEN    Culture   Final    No growth aerobically or anaerobically. Performed  at [facility], [place] [address] [place], [place] [address]    Report Status 02/04/2021 FINAL  Final  Aerobic/Anaerobic Culture w Gram Stain (surgical/deep wound)     Status: None (Preliminary result)   Collection Time: 02/03/21 11:24 AM   Specimen: Abscess  Result Value Ref Range Status   Specimen Description ABSCESS  Final   Special Requests DRAIN  Final   Gram Stain NO ORGANISMS SEEN  Final   Culture   Final    NO GROWTH < 24 HOURS Performed at [facility], [place] [address] [place], [place] [address]    Report Status PENDING  Incomplete    Studies/Results: CT IMAGE GUIDED FLUID DRAIN BY CATHETER  Result Date: 02/03/2021 INDICATION: Complex tubo-ovarian abscesses, post CT-guided percutaneous drainage catheter placement x2 on 01/30/2021. Subsequent abdominal CT performed 02/02/2021 demonstrates several undrained adnexal collections and as such patient presents again for repeat image guided drainage catheter placement. EXAM: CT-GUIDED ADNEXAL DRAINAGE CATHETER PLACEMENT X2 COMPARISON:  CT abdomen pelvis-02/02/2021; 05/16/2018 CT-guided percutaneous catheter placement x2-01/30/2021 Pelvic MRI-01/25/2021 MEDICATIONS: The patient is currently admitted to the hospital and receiving intravenous antibiotics. The antibiotics were administered within an appropriate time frame prior to the initiation of the procedure. ANESTHESIA/SEDATION: Moderate (conscious) sedation was employed during this procedure. A total of Versed 3 mg and Fentanyl 150 mcg was administered intravenously. Moderate Sedation Time: 42 minutes. The patient's level of consciousness and vital signs were monitored continuously by radiology nursing throughout the procedure under my direct supervision. CONTRAST:  None COMPLICATIONS: None immediate. PROCEDURE: Informed written  consent was obtained from the patient after a discussion of the risks, benefits and alternatives to treatment. The patient was placed supine on the CT gantry and a pre procedural CT was performed re-demonstrating the known ill-defined complex abscesses within the adnexa bilaterally with dominant ill-defined collection within the right adnexa measuring approximately 5.1 x 4.3 cm (image 30, series 2 and multiloculated collection along the midline of the lower abdomen/pelvis with right lateral component measuring approximately 6.3 x 4.3 cm (image 22, series 2, dominant collection within the midline of the abdomen measuring approximately 7.8 x 5.6 cm (image 13, series 2 and collection within the left side of the pelvis measuring approximately 3.6 x 3.4 cm (image 20, series 2). The CT gantry table position was marked and the ill-defined collections were identified sonographically. The procedure was planned. A timeout was performed prior to the initiation of the procedure. The skin overlying the ventral aspect the lower abdomen/pelvis was prepped and draped in the usual sterile fashion. The overlying soft tissues were anesthetized with 1% lidocaine with epinephrine. Under direct ultrasound guidance, the dominant collection within the right adnexa was targeted with an 18 gauge trocar needle allowing placement of a short Amplatz wire. Multiple ultrasound images were saved for procedural documentation purposes Next, under direct ultrasound guidance, the adjacent collections within the more midline of the lower abdomen/pelvis was targeted utilizing a right to left approach. This allowed for placement of a regular length Amplatz wire. Multiple ultrasound images were saved procedural documentation purposes. Appropriate position was confirmed with CT imaging. Both tracks were dilated ultimately allowing placement of a 12 French percutaneous drainage catheter within the dominant collection within right pelvis as well as a 12  [place] biliary type multi side-hole drainage catheter traversing multiple adjacent adnexal collections. Approximately 10 cc of slightly blood-tinged serous fluid was aspirated from the dominant collection within the right pelvis. A total of approximately 200 cc of slightly blood-tinged  serous fluid was aspirated from all the adjacent ill-defined collections within the more midline of the adnexa as the drainage catheter was slowly retracted ultimately with radiopaque side marker positioned adjacent to the entrance site of the accessed adnexal collection. A representative sample was capped and sent to the laboratory for analysis. Completion images were obtained (series 7), and the procedure was terminated. Both drainage catheters were secured at the skin entrance sites with interrupted sutures and both drainage catheters were connected to JP bulbs. Dressings were applied. The patient tolerated the procedure well without immediate post procedural complication. IMPRESSION: 1. Successful ultrasound and CT guided placement of a 12 French all purpose drain catheter into the dominant collection within the right pelvis yielding 10 cc of blood tinged serous fluid. 2. Successful ultrasound and CT-guided placement of a 12 French multi side-hole biliary type drainage catheter traversing several adjacent adnexal collections yielding a total of 200 cc of blood tinged serous fluid. A representative sample of aspirated fluid was capped and sent to the laboratory for analysis. Electronically Signed   By: Sandi Mariscal M.D.   On: 02/03/2021 13:26   CT IMAGE GUIDED DRAINAGE BY PERCUTANEOUS CATHETER  Result Date: 02/03/2021 INDICATION: Complex tubo-ovarian abscesses, post CT-guided percutaneous drainage catheter placement x2 on 01/30/2021. Subsequent abdominal CT performed 02/02/2021 demonstrates several undrained adnexal collections and as such patient presents again for repeat image guided drainage catheter placement. EXAM:  CT-GUIDED ADNEXAL DRAINAGE CATHETER PLACEMENT X2 COMPARISON:  CT abdomen pelvis-02/02/2021; 05/16/2018 CT-guided percutaneous catheter placement x2-01/30/2021 Pelvic MRI-01/25/2021 MEDICATIONS: The patient is currently admitted to the hospital and receiving intravenous antibiotics. The antibiotics were administered within an appropriate time frame prior to the initiation of the procedure. ANESTHESIA/SEDATION: Moderate (conscious) sedation was employed during this procedure. A total of Versed 3 mg and Fentanyl 150 mcg was administered intravenously. Moderate Sedation Time: 42 minutes. The patient's level of consciousness and vital signs were monitored continuously by radiology nursing throughout the procedure under my direct supervision. CONTRAST:  None COMPLICATIONS: None immediate. PROCEDURE: Informed written consent was obtained from the patient after a discussion of the risks, benefits and alternatives to treatment. The patient was placed supine on the CT gantry and a pre procedural CT was performed re-demonstrating the known ill-defined complex abscesses within the adnexa bilaterally with dominant ill-defined collection within the right adnexa measuring approximately 5.1 x 4.3 cm (image 30, series 2 and multiloculated collection along the midline of the lower abdomen/pelvis with right lateral component measuring approximately 6.3 x 4.3 cm (image 22, series 2, dominant collection within the midline of the abdomen measuring approximately 7.8 x 5.6 cm (image 13, series 2 and collection within the left side of the pelvis measuring approximately 3.6 x 3.4 cm (image 20, series 2). The CT gantry table position was marked and the ill-defined collections were identified sonographically. The procedure was planned. A timeout was performed prior to the initiation of the procedure. The skin overlying the ventral aspect the lower abdomen/pelvis was prepped and draped in the usual sterile fashion. The overlying soft tissues  were anesthetized with 1% lidocaine with epinephrine. Under direct ultrasound guidance, the dominant collection within the right adnexa was targeted with an 18 gauge trocar needle allowing placement of a short Amplatz wire. Multiple ultrasound images were saved for procedural documentation purposes Next, under direct ultrasound guidance, the adjacent collections within the more midline of the lower abdomen/pelvis was targeted utilizing a right to left approach. This allowed for placement of a regular length Amplatz wire. Multiple ultrasound  images were saved procedural documentation purposes. Appropriate position was confirmed with CT imaging. Both tracks were dilated ultimately allowing placement of a 12 French percutaneous drainage catheter within the dominant collection within right pelvis as well as a 12 [place] biliary type multi side-hole drainage catheter traversing multiple adjacent adnexal collections. Approximately 10 cc of slightly blood-tinged serous fluid was aspirated from the dominant collection within the right pelvis. A total of approximately 200 cc of slightly blood-tinged serous fluid was aspirated from all the adjacent ill-defined collections within the more midline of the adnexa as the drainage catheter was slowly retracted ultimately with radiopaque side marker positioned adjacent to the entrance site of the accessed adnexal collection. A representative sample was capped and sent to the laboratory for analysis. Completion images were obtained (series 7), and the procedure was terminated. Both drainage catheters were secured at the skin entrance sites with interrupted sutures and both drainage catheters were connected to JP bulbs. Dressings were applied. The patient tolerated the procedure well without immediate post procedural complication. IMPRESSION: 1. Successful ultrasound and CT guided placement of a 12 French all purpose drain catheter into the dominant collection within the right pelvis  yielding 10 cc of blood tinged serous fluid. 2. Successful ultrasound and CT-guided placement of a 12 French multi side-hole biliary type drainage catheter traversing several adjacent adnexal collections yielding a total of 200 cc of blood tinged serous fluid. A representative sample of aspirated fluid was capped and sent to the laboratory for analysis. Electronically Signed   By: Simonne Come M.D.   On: 02/03/2021 13:26   [place] EKG SITE RITE  Result Date: 02/04/2021 If Site Rite image not attached, placement could not be confirmed due to current cardiac rhythm.     Assessment/Plan:  INTERVAL HISTORY: IR placed 2 drains on 10/31 and sent material for cultures, negative at 24 hours   Principal Problem:   TOA (tubo-ovarian abscess) Active Problems:   Trichomoniasis   BMI 40.0-44.9, adult (HCC)   Hypertension   Abdominal fluid collection   Abscess   Intra-abdominal abscess (HCC)    Christy Schaefer is a 40 y.o. female with multiple tubo-ovarian abscesses (vs malignancy) sp drains placed with culture not yielding an organism, fevered through Augmentin and doxycycline.  Trichomonas was positive from pelvic exam.  #1 Tubo-ovarian abscesses (vs malignancy)  Four drains in place, emptied before exam today. Repeat cultures taken are negative , still at 24 hours.  Patient reports being comfortable with plan for discharge with IV Ceftriaxone and aim for one month via charity care. Also should continue metronidazole 500 mg BID. HH arranged. PICC line placed 11/1. Scheduled patient for follow up, Friday November 18th at 9 am .   #2 HIV testing: HIV ab negative RNA <20  She would benefit from being on PrEP, preferably [place] Apretude though could consider Descovy for PrEP in meantime. Plan to discuss at follow up in ID Clinic.   Thurmon Fair, MD PGY3 Internal Medicine [phone]

## 2021-02-05 NOTE — Care Management Note (Addendum)
Case Management Note  Patient Details  Name: Christy Schaefer MRN: [id number] Date of Birth: 08/27/1980  Subjective/Objective:                  Christy Schaefer is a 41 y.o. female with multiple tubo-ovarian abscesses    Expected Discharge Date:  02/05/2021              Expected Discharge Plan:  02/05/2021  In-House Referral:  Clinical Social Work  Discharge planning Services  CM Consult, Follow-up appt scheduled, [facility]; PCP  Post Acute Care Choice:  Home Health Choice offered to:  patient   HH Arranged:  RN- IV Rocephin every 24 hours  [place]:  [place] [facility]   Additional Comments: CM met with patient in room this am and met with MD on floor.  Plan is for discharged today and patient agreement with plan after antibiotic infusion today.  Home Health agency - [facility] - confirmed by [facility] - hospital liaison will make visit tomorrow Thursday for 1:00 Iv antibiotic dose. Pam with [facility] has done teaching with patient and will have medicine shipped to patient's home .  Saline flushes will be sent home with patient by floor and TOC will provide medication and CM put in [facility] b/c patient does not have insurance.  Patient feels like she can pay for her pain medicine - narcotic she expressed to the MD. Patient does not have a PCP and she agreed to go to [facility]. CM made PCP appointment for December 7th at 1:50 pm with Dr. Margarita Rana. (Placed in AVS). CM called Nita with financial counselor to be screened and CM also left message with Medassist with [facility] to follow up with patient regarding self pay status and to see if patient qualifies for [facility]. Patient lives with mom, and 2 daughters ages 49 and 58 and does not drive.  She has been employed at [employer] ( 12 hour shifts) but currently has not been working.  She uses public transportation or her cousin takes her places. Patient does have [facility] line for antx and also 4 drains and  will have [facility] to start care in the home tomorrow. CM spoke to [facility]  CSW and she will follow up with patient for transportation needs and resources.  CM called and made IR appointment on 02/13/21 at 1:00 pm at [facility] . ( See AVS).   Rosita Fire RNC-MNN, BSN Transitions of Care Pediatrics/Women's and [place]  02/05/2021, 9:59 AM

## 2021-02-05 NOTE — Progress Notes (Addendum)
Referring Physician(s): * No referring provider recorded for this case *  Supervising Physician: Sandi Mariscal  Patient Status:  [facility] - In-pt  Chief Complaint:    - 01/30/21:Successful CT-guided 2 drain placement of 52 F catheters into hydrosalpinx (midline pelvis) and TOA (supraumbilical abdomen).   - 02/03/21: Successful ultrasound and CT guided placement of a 12 French all purpose drain catheter into the dominant collection within the right pelvis  -Successful ultrasound and CT-guided placement of a 12 French multi side-hole biliary type drainage catheter traversing several adjacent adnexal collections  Subjective:  Pt resting quietly in bed. She states she is feeling better.  Pt complains of pain when flushing proximal RLQ drain.  She has no other complaints.   Allergies: Patient has no known allergies.  Medications: Prior to Admission medications   Medication Sig Start Date End Date Taking? Authorizing Provider  cefTRIAXone (ROCEPHIN) IVPB Inject 2 g into the vein daily for 27 days. Indication:  Tubo-ovarian abscess First Dose: Yes Last Day of Therapy:  03/03/2021 Labs - Once weekly:  CBC/D and BMP, Labs - Every other week:  ESR and CRP Method of administration: IV Push Method of administration may be changed at the discretion of home infusion pharmacist based upon assessment of the patient and/or caregiver's ability to self-administer the medication ordered. 02/05/21 03/04/21 Yes Janyth Pupa, DO  docusate sodium (COLACE) 100 MG capsule Take 1 capsule (100 mg total) by mouth 2 (two) times daily. 02/05/21 03/07/21 Yes Janyth Pupa, DO  ibuprofen (ADVIL) 600 MG tablet Take 1 tablet (600 mg total) by mouth every 6 (six) hours as needed. 02/05/21  Yes Janyth Pupa, DO  metroNIDAZOLE (FLAGYL) 500 MG tablet Take 1 tablet (500 mg total) by mouth 3 (three) times daily for 27 days. Stop date 03/03/2021 02/05/21 03/04/21 Yes Janyth Pupa, DO  oxyCODONE (OXY IR/ROXICODONE)  5 MG immediate release tablet Take 1-2 tablets (5-10 mg total) by mouth every 6 (six) hours as needed for up to 5 days for severe pain. 02/05/21 02/10/21 Yes Janyth Pupa, DO  acetaminophen (TYLENOL) 325 MG tablet Take 2 tablets (650 mg total) by mouth every 4 (four) hours as needed for fever. 02/05/21 03/07/21  Janyth Pupa, DO  doxycycline (VIBRAMYCIN) 100 MG capsule Take 1 capsule (100 mg total) by mouth 2 (two) times daily for 14 days. 01/25/21 02/08/21  Loeffler, Adora Fridge, PA-C  metroNIDAZOLE (FLAGYL) 500 MG tablet Take 1 tablet (500 mg total) by mouth 2 (two) times daily for 14 days. 01/25/21 02/08/21  Loeffler, Adora Fridge, PA-C     Vital Signs: BP 127/80 (BP Location: Left Arm)   Pulse 84   Temp 98 F (36.7 C) (Oral)   Resp 18   Ht _0  (1.626 m)   Wt 233 lb (105.7 kg)   LMP 01/22/2021 (Approximate)   SpO2 98%   BMI 39.99 kg/m   Physical Exam Constitutional:      Appearance: Normal appearance. She is not ill-appearing.  HENT:     Head: Normocephalic and atraumatic.  Cardiovascular:     Rate and Rhythm: Normal rate.  Pulmonary:     Effort: Pulmonary effort is normal.  Abdominal:     General: There is no distension.     Tenderness: There is no abdominal tenderness. There is no guarding.     Comments: Promixal RLQ drain: pt has pain when flushing, aspirated bright red blood-scant ant OP in JP with 33 cc OP in 24 hours. Insertion site unremarkable with sutures/ statlock  in place. Dressing C/D/I Distal RLQ drain: scant amt serosanguinous fluid in JP, flushes easily. 37 cc OP in 24 hours. Insertion site unremarkable with sutures/stat lock in place.Dressing C/D/I Midline drain: scant amt yellow fluid in gravity bag with 15 cc documented in Epic. Flushes easily. Insertion site unremakable with sutures/statlock in place. Dressing C/D/I.  LLQ drain: scant amt yellowish fluid to gravity bag with 4 cc documented OP in 24 hours. Flushed easily. Insertion site unremarkable with  sutures/statlock in place. Dressing C/D/I.    Skin:    General: Skin is warm and dry.  Neurological:     Mental Status: She is alert.    Imaging: CT ABDOMEN PELVIS W CONTRAST  Result Date: 02/02/2021 CLINICAL DATA:  Intra-abdominal abscess. EXAM: CT ABDOMEN AND PELVIS WITH CONTRAST TECHNIQUE: Multidetector CT imaging of the abdomen and pelvis was performed using the standard protocol following bolus administration of intravenous contrast. CONTRAST:  140m OMNIPAQUE IOHEXOL 300 MG/ML  SOLN COMPARISON:  CT abdomen pelvis 05/16/2018, CT abdomen pelvis 01/25/2021, MRI abdomen 01/25/2021, CT image guided procedure 01/30/2021 FINDINGS: Lower chest: Bilateral trace pleural effusions. Bilateral lower lobe atelectasis. Hepatobiliary: No focal liver abnormality. Status post cholecystectomy. No biliary dilatation. Pancreas: No focal lesion. Normal pancreatic contour. No surrounding inflammatory changes. No main pancreatic ductal dilatation. Spleen: Normal in size without focal abnormality. Adrenals/Urinary Tract: No adrenal nodule bilaterally. Question interval developing trace delayed left nephrogram with bilateral distal ureters coursing along the pelvic mass. No striated nephrogram No hydronephrosis. No hydroureter. No definite urothelial thickening. No nephroureterolithiasis. The urinary bladder is unremarkable. Stomach/Bowel: PO contrast reaches the large bowel. Stomach is within normal limits. No evidence of bowel wall thickening or dilatation. Appendix appears normal. Vascular/Lymphatic: No abdominal aorta or iliac aneurysm.Multiple retroperitoneal lymph nodes measure at the upper limits of normal. There is an enlarged left periaortic lymph node measuring up to 1.2 cm (4:46). Prominent but nonenlarged right pelvic wall lymph node (4:75). No abdominal, pelvic, or inguinal lymphadenopathy. Reproductive: Redemonstration of a complex multi-cystic pelvic mass measuring up to least 15.8 x 8.5 x 10.8 cm. Interval  decrease in size of the superior most largest in inferior-most largest component. The lesion is noted to be inseparable from the fundus of the uterus. The bilateral ovaries are not definitely identified. Other: Interval placement of two pelvic pigtail catheters terminating within the known complex multicystic mass lesion. Trace free fluid within the pelvis. No free gas. Musculoskeletal: No abdominal wall hernia or abnormality. No suspicious lytic or blastic osseous lesions. No acute displaced fracture. Multilevel degenerative changes of the spine. IMPRESSION: 1. Interval placement of two pelvic catheters terminating within a known 15.8 x 8.5 x 3 8 cm complex multicystic pelvic mass lesion with slight interval decrease in size of the two largest component. As stated on MRI abdomen 01/25/2021, this could represent a tubo-ovarian abscess versus neoplasm versus endometriosis. 2. Multiple retroperitoneal borderline enlarged and prominent lymph nodes. Differential diagnosis includes reactive changes versus pathology. 3. Question interval developing trace delayed left nephrogram with bilateral distal ureters coursing along the pelvic mass. No definite hydronephrosis or urothelial thickening. 4. Trace simple free fluid pelvic ascites. Electronically Signed   By: MIven FinnM.D.   On: 02/02/2021 16:27   CT IMAGE GUIDED FLUID DRAIN BY CATHETER  Result Date: 02/03/2021 INDICATION: Complex tubo-ovarian abscesses, post CT-guided percutaneous drainage catheter placement x2 on 01/30/2021. Subsequent abdominal CT performed 02/02/2021 demonstrates several undrained adnexal collections and as such patient presents again for repeat image guided drainage catheter placement. EXAM: CT-GUIDED  ADNEXAL DRAINAGE CATHETER PLACEMENT X2 COMPARISON:  CT abdomen pelvis-02/02/2021; 05/16/2018 CT-guided percutaneous catheter placement x2-01/30/2021 Pelvic MRI-01/25/2021 MEDICATIONS: The patient is currently admitted to the hospital and  receiving intravenous antibiotics. The antibiotics were administered within an appropriate time frame prior to the initiation of the procedure. ANESTHESIA/SEDATION: Moderate (conscious) sedation was employed during this procedure. A total of Versed 3 mg and Fentanyl 150 mcg was administered intravenously. Moderate Sedation Time: 42 minutes. The patient's level of consciousness and vital signs were monitored continuously by radiology nursing throughout the procedure under my direct supervision. CONTRAST:  None COMPLICATIONS: None immediate. PROCEDURE: Informed written consent was obtained from the patient after a discussion of the risks, benefits and alternatives to treatment. The patient was placed supine on the CT gantry and a pre procedural CT was performed re-demonstrating the known ill-defined complex abscesses within the adnexa bilaterally with dominant ill-defined collection within the right adnexa measuring approximately 5.1 x 4.3 cm (image 30, series 2 and multiloculated collection along the midline of the lower abdomen/pelvis with right lateral component measuring approximately 6.3 x 4.3 cm (image 22, series 2, dominant collection within the midline of the abdomen measuring approximately 7.8 x 5.6 cm (image 13, series 2 and collection within the left side of the pelvis measuring approximately 3.6 x 3.4 cm (image 20, series 2). The CT gantry table position was marked and the ill-defined collections were identified sonographically. The procedure was planned. A timeout was performed prior to the initiation of the procedure. The skin overlying the ventral aspect the lower abdomen/pelvis was prepped and draped in the usual sterile fashion. The overlying soft tissues were anesthetized with 1% lidocaine with epinephrine. Under direct ultrasound guidance, the dominant collection within the right adnexa was targeted with an 18 gauge trocar needle allowing placement of a short Amplatz wire. Multiple ultrasound images  were saved for procedural documentation purposes Next, under direct ultrasound guidance, the adjacent collections within the more midline of the lower abdomen/pelvis was targeted utilizing a right to left approach. This allowed for placement of a regular length Amplatz wire. Multiple ultrasound images were saved procedural documentation purposes. Appropriate position was confirmed with CT imaging. Both tracks were dilated ultimately allowing placement of a 12 French percutaneous drainage catheter within the dominant collection within right pelvis as well as a 12 [place] biliary type multi side-hole drainage catheter traversing multiple adjacent adnexal collections. Approximately 10 cc of slightly blood-tinged serous fluid was aspirated from the dominant collection within the right pelvis. A total of approximately 200 cc of slightly blood-tinged serous fluid was aspirated from all the adjacent ill-defined collections within the more midline of the adnexa as the drainage catheter was slowly retracted ultimately with radiopaque side marker positioned adjacent to the entrance site of the accessed adnexal collection. A representative sample was capped and sent to the laboratory for analysis. Completion images were obtained (series 7), and the procedure was terminated. Both drainage catheters were secured at the skin entrance sites with interrupted sutures and both drainage catheters were connected to JP bulbs. Dressings were applied. The patient tolerated the procedure well without immediate post procedural complication. IMPRESSION: 1. Successful ultrasound and CT guided placement of a 12 French all purpose drain catheter into the dominant collection within the right pelvis yielding 10 cc of blood tinged serous fluid. 2. Successful ultrasound and CT-guided placement of a 12 French multi side-hole biliary type drainage catheter traversing several adjacent adnexal collections yielding a total of 200 cc of blood tinged  serous fluid. A  representative sample of aspirated fluid was capped and sent to the laboratory for analysis. Electronically Signed   By: Sandi Mariscal M.D.   On: 02/03/2021 13:26   CT IMAGE GUIDED DRAINAGE BY PERCUTANEOUS CATHETER  Result Date: 02/03/2021 INDICATION: Complex tubo-ovarian abscesses, post CT-guided percutaneous drainage catheter placement x2 on 01/30/2021. Subsequent abdominal CT performed 02/02/2021 demonstrates several undrained adnexal collections and as such patient presents again for repeat image guided drainage catheter placement. EXAM: CT-GUIDED ADNEXAL DRAINAGE CATHETER PLACEMENT X2 COMPARISON:  CT abdomen pelvis-02/02/2021; 05/16/2018 CT-guided percutaneous catheter placement x2-01/30/2021 Pelvic MRI-01/25/2021 MEDICATIONS: The patient is currently admitted to the hospital and receiving intravenous antibiotics. The antibiotics were administered within an appropriate time frame prior to the initiation of the procedure. ANESTHESIA/SEDATION: Moderate (conscious) sedation was employed during this procedure. A total of Versed 3 mg and Fentanyl 150 mcg was administered intravenously. Moderate Sedation Time: 42 minutes. The patient's level of consciousness and vital signs were monitored continuously by radiology nursing throughout the procedure under my direct supervision. CONTRAST:  None COMPLICATIONS: None immediate. PROCEDURE: Informed written consent was obtained from the patient after a discussion of the risks, benefits and alternatives to treatment. The patient was placed supine on the CT gantry and a pre procedural CT was performed re-demonstrating the known ill-defined complex abscesses within the adnexa bilaterally with dominant ill-defined collection within the right adnexa measuring approximately 5.1 x 4.3 cm (image 30, series 2 and multiloculated collection along the midline of the lower abdomen/pelvis with right lateral component measuring approximately 6.3 x 4.3 cm (image 22, series  2, dominant collection within the midline of the abdomen measuring approximately 7.8 x 5.6 cm (image 13, series 2 and collection within the left side of the pelvis measuring approximately 3.6 x 3.4 cm (image 20, series 2). The CT gantry table position was marked and the ill-defined collections were identified sonographically. The procedure was planned. A timeout was performed prior to the initiation of the procedure. The skin overlying the ventral aspect the lower abdomen/pelvis was prepped and draped in the usual sterile fashion. The overlying soft tissues were anesthetized with 1% lidocaine with epinephrine. Under direct ultrasound guidance, the dominant collection within the right adnexa was targeted with an 18 gauge trocar needle allowing placement of a short Amplatz wire. Multiple ultrasound images were saved for procedural documentation purposes Next, under direct ultrasound guidance, the adjacent collections within the more midline of the lower abdomen/pelvis was targeted utilizing a right to left approach. This allowed for placement of a regular length Amplatz wire. Multiple ultrasound images were saved procedural documentation purposes. Appropriate position was confirmed with CT imaging. Both tracks were dilated ultimately allowing placement of a 12 French percutaneous drainage catheter within the dominant collection within right pelvis as well as a 12 [place] biliary type multi side-hole drainage catheter traversing multiple adjacent adnexal collections. Approximately 10 cc of slightly blood-tinged serous fluid was aspirated from the dominant collection within the right pelvis. A total of approximately 200 cc of slightly blood-tinged serous fluid was aspirated from all the adjacent ill-defined collections within the more midline of the adnexa as the drainage catheter was slowly retracted ultimately with radiopaque side marker positioned adjacent to the entrance site of the accessed adnexal collection. A  representative sample was capped and sent to the laboratory for analysis. Completion images were obtained (series 7), and the procedure was terminated. Both drainage catheters were secured at the skin entrance sites with interrupted sutures and both drainage catheters were connected to JP bulbs. Dressings  were applied. The patient tolerated the procedure well without immediate post procedural complication. IMPRESSION: 1. Successful ultrasound and CT guided placement of a 12 French all purpose drain catheter into the dominant collection within the right pelvis yielding 10 cc of blood tinged serous fluid. 2. Successful ultrasound and CT-guided placement of a 12 French multi side-hole biliary type drainage catheter traversing several adjacent adnexal collections yielding a total of 200 cc of blood tinged serous fluid. A representative sample of aspirated fluid was capped and sent to the laboratory for analysis. Electronically Signed   By: Sandi Mariscal M.D.   On: 02/03/2021 13:26   [place] EKG SITE RITE  Result Date: 02/04/2021 If Site Rite image not attached, placement could not be confirmed due to current cardiac rhythm.   Labs:  CBC: Recent Labs    02/02/21 0916 02/03/21 0458 02/04/21 0738 02/05/21 0535  WBC 12.7* 11.8* 9.1 9.8  HGB 10.5* 11.1* 10.6* 10.3*  HCT 32.2* 33.9* 32.6* 32.0*  PLT 461* 333 472* 419*    COAGS: No results for input(s): INR, APTT in the last 8760 hours.  BMP: Recent Labs    01/25/21 1322 01/27/21 1809 01/29/21 1636 02/04/21 0738  NA 136 136 138 132*  K 3.6 3.0* 3.1* 3.2*  CL 102 102 102 96*  CO2 _0 GLUCOSE 94 101* 88 104*  BUN _1 <5*  CALCIUM 9.0 8.9 9.0 8.2*  CREATININE 0.62 0.57 0.69 0.57  GFRNONAA >60 >60 >60 >60    LIVER FUNCTION TESTS: Recent Labs    01/25/21 1322 01/27/21 1809 01/29/21 1636  BILITOT 0.3 0.3 0.5  AST 12* 12* 17  ALT _2 ALKPHOS 60 55 54  PROT 9.3* 9.2* 8.9*  ALBUMIN 3.4* 3.2* 2.9*    Assessment and  Plan:  Pt resting quietly in bed. She states she is feeling better.  Pt complains of pain when flushing proximal RLQ drain.  She has no other complaints.   Promixal RLQ drain: pt has pain when flushing, aspirated bright red blood-scant ant OP in JP with 33 cc OP in 24 hours. Insertion site unremarkable with sutures/ statlock in place. Dressing C/D/I Distal RLQ drain: scant amt serosanguinous fluid in JP, flushes easily. 37 cc OP in 24 hours. Insertion site unremarkable with sutures/stat lock in place.Dressing C/D/I Midline drain: scant amt yellow fluid in gravity bag with 15 cc documented in Epic. Flushes easily. Insertion site unremakable with sutures/statlock in place. Dressing C/D/I.  LLQ drain: scant amt yellowish fluid to gravity bag with 4 cc documented OP in 24 hours. Flushed easily. Insertion site unremarkable with sutures/statlock in place. Dressing C/D/I.   WBC 9.8 No organisms seen on culture. Pt afebrile. VSS Pt to be discharged home today.   Continue to document OP in Epic. Flush q shift.  Change dressing as needed. Pt has appointment to follow up with IR at [facility] in 1 week.   Electronically Signed: Tyson Alias, NP 02/05/2021, 11:06 AM   I spent a total of 15 Minutes at the the patient's bedside AND on the patient's hospital floor or unit, greater than 50% of which was counseling/coordinating care for abdominal/pelvic drains.

## 2021-02-06 ENCOUNTER — Other Ambulatory Visit: Payer: Self-pay | Admitting: Infectious Disease

## 2021-02-06 DIAGNOSIS — R188 Other ascites: Secondary | ICD-10-CM

## 2021-02-08 LAB — AEROBIC/ANAEROBIC CULTURE W GRAM STAIN (SURGICAL/DEEP WOUND)
Culture: NO GROWTH
Gram Stain: NONE SEEN

## 2021-02-13 ENCOUNTER — Other Ambulatory Visit: Payer: Self-pay

## 2021-02-17 ENCOUNTER — Telehealth: Payer: Self-pay

## 2021-02-17 NOTE — Telephone Encounter (Signed)
[facility] contacted office to inquire about management of drains and PICC line. Patient has no insurance currently and follow up radiology appointments have been cancelled due to insurance status. RN advised that patient can be scheduled at the hospital for drain management/imaging if needed but that our office/team does not manage drains. She would need to follow up with OB since they were following her inpatient. RN advised that our office is managing the PICC line and antibiotics. Patient has appointments scheduled with Dr. Daiva Eves and OB scheduled; [facility] team will follow up with OB to determine next steps with drains.   Christy Larimer Loyola Mast, RN

## 2021-02-20 ENCOUNTER — Other Ambulatory Visit: Payer: Self-pay

## 2021-02-20 ENCOUNTER — Ambulatory Visit
Admission: RE | Admit: 2021-02-20 | Discharge: 2021-02-20 | Disposition: A | Discharge status: Home / Self Care | Payer: Self-pay | Source: Ambulatory Visit | Attending: Radiology | Admitting: Radiology

## 2021-02-20 ENCOUNTER — Encounter: Payer: Self-pay | Admitting: *Deleted

## 2021-02-20 ENCOUNTER — Ambulatory Visit
Admission: RE | Admit: 2021-02-20 | Discharge: 2021-02-20 | Disposition: A | Discharge status: Home / Self Care | Payer: Self-pay | Source: Ambulatory Visit | Attending: Infectious Disease | Admitting: Infectious Disease

## 2021-02-20 DIAGNOSIS — R188 Other ascites: Secondary | ICD-10-CM

## 2021-02-20 HISTORY — PX: IR RADIOLOGIST EVAL & MGMT: IMG5224

## 2021-02-20 MED ORDER — IOPAMIDOL (ISOVUE-300) INJECTION 61%
100.0000 mL | Freq: Once | INTRAVENOUS | Status: AC | PRN
  Administered 2021-02-20: 13:00:00 100 mL via INTRAVENOUS

## 2021-02-20 NOTE — Progress Notes (Signed)
Referring Physician(s): Dr Kathlen Mody Dr Rogene [place]  Chief Complaint: The patient is seen in follow up today s/p 10/27 procedure: 1. Successful CT guided placement of 10 Fr drain catheters into the midline pelvis and supraumbilical abdomen for tubo-ovarian abscess  10/31 procedure: 1. Successful ultrasound and CT guided placement of a 12 French all purpose drain catheter into the dominant collection within the right pelvis  2. Successful ultrasound and CT-guided placement of a 12 French multi side-hole biliary type drainage catheter traversing several adjacent adnexal collections    History of present illness:  Tubo ovarian abscesses followed by Dr Nelda Marseille 2 drains placed in IR 10/27 2 more drains placed 10/31 All 4 drains remain She flushes all 4 daily OP equals flush OP looks serous color She denies any pain Denies fever Still taking antibiotics--HHRN weekly IV PICC Flagyl antibiotics po She has appt with ID Dr Tommy Medal tomorrow  Scheduled for CT and evaluation today   No past medical history on file.  Past Surgical History:  Procedure Laterality Date   CHOLECYSTECTOMY      Allergies: Patient has no known allergies.  Medications: Prior to Admission medications   Medication Sig Start Date End Date Taking? Authorizing Provider  acetaminophen (TYLENOL) 325 MG tablet Take 2 tablets (650 mg total) by mouth every 4 (four) hours as needed for fever. 02/05/21 03/07/21  Janyth Pupa, DO  cefTRIAXone (ROCEPHIN) IVPB Inject 2 g into the vein daily for 27 days. Indication:  Tubo-ovarian abscess First Dose: Yes Last Day of Therapy:  03/03/2021 Labs - Once weekly:  CBC/D and BMP, Labs - Every other week:  ESR and CRP Method of administration: IV Push Method of administration may be changed at the discretion of home infusion pharmacist based upon assessment of the patient and/or caregiver's ability to self-administer the medication ordered. 02/05/21 03/04/21  Janyth Pupa, DO   docusate sodium (COLACE) 100 MG capsule Take 1 capsule (100 mg total) by mouth 2 (two) times daily. 02/05/21 03/07/21  Janyth Pupa, DO  ibuprofen (ADVIL) 600 MG tablet Take 1 tablet (600 mg total) by mouth every 6 (six) hours as needed. 02/05/21   Janyth Pupa, DO  metroNIDAZOLE (FLAGYL) 500 MG tablet Take 1 tablet (500 mg total) by mouth 3 (three) times daily for 27 days. Stop date 03/03/2021 02/05/21 03/04/21  Janyth Pupa, DO     No family history on file.  Social History   Socioeconomic History   Marital status: Single    Spouse name: Not on file   Number of children: Not on file   Years of education: Not on file   Highest education level: Not on file  Occupational History   Not on file  Tobacco Use   Smoking status: Every Day    Packs/day: 0.75    Types: Cigarettes   Smokeless tobacco: Never  Vaping Use   Vaping Use: Never used  Substance and Sexual Activity   Alcohol use: Yes    Comment: rare   Drug use: Never   Sexual activity: Yes    Birth control/protection: None  Other Topics Concern   Not on file  Social History Narrative   Not on file   Social Determinants of Health   Financial Resource Strain: Not on file  Food Insecurity: Not on file  Transportation Needs: Not on file  Physical Activity: Not on file  Stress: Not on file  Social Connections: Not on file     Vital Signs: LMP 01/22/2021 (Approximate)  Physical Exam Vitals reviewed.  Skin:    General: Skin is warm.     Comments: Sites are all clean and dry NT no bleeding No sign of infection  OP in bags and JPs all serous color Scant collection since yesterday  CT showing almost complete resolution of abscesses per Dr Maryelizabeth Kaufmann Inferior Rt drain is out of position-- in Sub cutaneous tissue  Neurological:     Mental Status: She is alert.    Imaging: No results found.  Labs:  CBC: Recent Labs    02/02/21 0916 02/03/21 0458 02/04/21 0738 02/05/21 0535  WBC 12.7* 11.8* 9.1 9.8   HGB 10.5* 11.1* 10.6* 10.3*  HCT 32.2* 33.9* 32.6* 32.0*  PLT 461* 333 472* 419*    COAGS: No results for input(s): INR, APTT in the last 8760 hours.  BMP: Recent Labs    01/25/21 1322 01/27/21 1809 01/29/21 1636 02/04/21 0738  NA 136 136 138 132*  K 3.6 3.0* 3.1* 3.2*  CL 102 102 102 96*  CO2 '26 25 25 27  ' GLUCOSE 94 101* 88 104*  BUN '8 7 6 ' <5*  CALCIUM 9.0 8.9 9.0 8.2*  CREATININE 0.62 0.57 0.69 0.57  GFRNONAA >60 >60 >60 >60    LIVER FUNCTION TESTS: Recent Labs    01/25/21 1322 01/27/21 1809 01/29/21 1636  BILITOT 0.3 0.3 0.5  AST 12* 12* 17  ALT '14 12 15  ' ALKPHOS 60 55 54  PROT 9.3* 9.2* 8.9*  ALBUMIN 3.4* 3.2* 2.9*    Assessment:  Tubo ovarian abscesses 4 drain placed in IR All are draining scant amount Flush easily No fever; no chills CT revealing resolution Drains all removed today per Dr Maryelizabeth Kaufmann  Pt to keep appt tomorrow with Dr Tommy Medal  Signed: Lavonia Drafts, PA-C 02/20/2021, 1:48 PM   Please refer to Dr. Maryelizabeth Kaufmann attestation of this note for management and plan.

## 2021-02-21 ENCOUNTER — Encounter: Payer: Self-pay | Admitting: Infectious Disease

## 2021-02-21 ENCOUNTER — Telehealth: Payer: Self-pay

## 2021-02-21 ENCOUNTER — Other Ambulatory Visit: Payer: Self-pay

## 2021-02-21 ENCOUNTER — Ambulatory Visit (INDEPENDENT_AMBULATORY_CARE_PROVIDER_SITE_OTHER): Payer: Self-pay | Admitting: Infectious Disease

## 2021-02-21 ENCOUNTER — Other Ambulatory Visit (HOSPITAL_COMMUNITY): Payer: Self-pay

## 2021-02-21 VITALS — BP 141/90 | HR 80 | Temp 98.0°F | Wt 243.0 lb

## 2021-02-21 DIAGNOSIS — A599 Trichomoniasis, unspecified: Secondary | ICD-10-CM

## 2021-02-21 DIAGNOSIS — Z79899 Other long term (current) drug therapy: Secondary | ICD-10-CM

## 2021-02-21 DIAGNOSIS — I158 Other secondary hypertension: Secondary | ICD-10-CM

## 2021-02-21 DIAGNOSIS — R188 Other ascites: Secondary | ICD-10-CM

## 2021-02-21 DIAGNOSIS — L0291 Cutaneous abscess, unspecified: Secondary | ICD-10-CM

## 2021-02-21 DIAGNOSIS — N7093 Salpingitis and oophoritis, unspecified: Secondary | ICD-10-CM

## 2021-02-21 DIAGNOSIS — Z6841 Body Mass Index (BMI) 40.0 and over, adult: Secondary | ICD-10-CM

## 2021-02-21 DIAGNOSIS — K651 Peritoneal abscess: Secondary | ICD-10-CM

## 2021-02-21 HISTORY — DX: Other long term (current) drug therapy: Z79.899

## 2021-02-21 MED ORDER — CIPROFLOXACIN HCL 500 MG PO TABS: 500.0000 mg | ORAL_TABLET | Freq: Two times a day (BID) | ORAL | 0 refills | Status: AC

## 2021-02-21 NOTE — Telephone Encounter (Signed)
[facility] Patient Advocate Encounter  Completed and sent [employer] application for Apretude for this patient who is uninsured.    Patient assistance phone number for follow up is [phone].   This encounter will be updated until final determination.   Clearance Coots, CPhT Specialty Pharmacy Patient [facility] Phone: [phone] Fax:  [phone]

## 2021-02-21 NOTE — Telephone Encounter (Signed)
Per MD reached out to [facility] to have pts picc pulled today by home health. Relayed verbal order to [facility] with [facility] who will update nursing.  Juanita Laster, RMA

## 2021-02-21 NOTE — Telephone Encounter (Signed)
Thank you :)

## 2021-02-21 NOTE — Progress Notes (Signed)
Subjective:  Chief complaint she is had an large abscess that has been drained by IR and she has had drains out is eager to return to work  Patient ID: Christy Schaefer, female    DOB: 01-09-1981, 40 y.o.   MRN: [id number]  HPI  Christy Schaefer is a 40 year old Black woman who I saw in the hospital last month quite large intra-abdominal fluid collection thought to be a complicated tubo-ovarian abscess.  This required 4 drains to be placed and we ultimately treated her with ceftriaxone and metronidazole.  [facility] has been providing the ceftriaxone through charity care and metronidazole was provided through the match program.  She has had follow-up with interventional radiology who have remove the drains but due to the dramatic improvement in size of her intra-abdominal abscesses.  This was done yesterday CT scan done on February 21, 2019 which I personally reviewed shows near complete resolution of her multiloculated adnexal fluid collections.  The largest one now is 3 x 2.7 cm versus 10.2 x 9.1 on January 25, 2021.  Still has PICC line in place and is been getting ceftriaxone and metronidazole she is eager to return to work.   I feel comfortable doing this as long as he can replace her ceftriaxone with a different antibiotic and ciprofloxacin apparently is that most affordable 1 that we could use.    She did have trichomonas on testing in the hospital and was interested in HIV preexposure prophylaxis.  We will therefore get another HIV test today and I have given her a month supply of DESCOVY we are putting an application to get her approved for intramuscular aptitude due to its superiority vs oral PrEP in women.      No past medical history on file.  Past Surgical History:  Procedure Laterality Date   CHOLECYSTECTOMY     IR RADIOLOGIST EVAL & MGMT  02/20/2021    No family history on file.    Social History   Socioeconomic History   Marital status: Single    Spouse name: Not  on file   Number of children: Not on file   Years of education: Not on file   Highest education level: Not on file  Occupational History   Not on file  Tobacco Use   Smoking status: Every Day    Packs/day: 0.75    Types: Cigarettes   Smokeless tobacco: Never  Vaping Use   Vaping Use: Never used  Substance and Sexual Activity   Alcohol use: Yes    Comment: rare   Drug use: Never   Sexual activity: Yes    Birth control/protection: None  Other Topics Concern   Not on file  Social History Narrative   Not on file   Social Determinants of Health   Financial Resource Strain: Not on file  Food Insecurity: Not on file  Transportation Needs: Not on file  Physical Activity: Not on file  Stress: Not on file  Social Connections: Not on file    No Known Allergies   Current Outpatient Medications:    acetaminophen (TYLENOL) 325 MG tablet, Take 2 tablets (650 mg total) by mouth every 4 (four) hours as needed for fever., Disp: 60 tablet, Rfl: 1   cefTRIAXone (ROCEPHIN) IVPB, Inject 2 g into the vein daily for 27 days. Indication:  Tubo-ovarian abscess First Dose: Yes Last Day of Therapy:  03/03/2021 Labs - Once weekly:  CBC/D and BMP, Labs - Every other week:  ESR and CRP  Method of administration: IV Push Method of administration may be changed at the discretion of home infusion pharmacist based upon assessment of the patient and/or caregiver's ability to self-administer the medication ordered., Disp: 27 Units, Rfl: 0   docusate sodium (COLACE) 100 MG capsule, Take 1 capsule (100 mg total) by mouth 2 (two) times daily., Disp: 60 capsule, Rfl: 0   ibuprofen (ADVIL) 600 MG tablet, Take 1 tablet (600 mg total) by mouth every 6 (six) hours as needed., Disp: 60 tablet, Rfl: 0   metroNIDAZOLE (FLAGYL) 500 MG tablet, Take 1 tablet (500 mg total) by mouth 3 (three) times daily for 27 days. Stop date 03/03/2021, Disp: 81 tablet, Rfl: 0   Review of Systems  Constitutional:  Negative for activity  change, appetite change, chills, diaphoresis, fatigue, fever and unexpected weight change.  HENT:  Negative for congestion, rhinorrhea, sinus pressure, sneezing, sore throat and trouble swallowing.   Eyes:  Negative for photophobia and visual disturbance.  Respiratory:  Negative for cough, chest tightness, shortness of breath, wheezing and stridor.   Cardiovascular:  Negative for chest pain, palpitations and leg swelling.  Gastrointestinal:  Negative for abdominal distention, abdominal pain, anal bleeding, blood in stool, constipation, diarrhea, nausea and vomiting.  Genitourinary:  Negative for difficulty urinating, dysuria, flank pain and hematuria.  Musculoskeletal:  Negative for arthralgias, back pain, gait problem, joint swelling and myalgias.  Skin:  Negative for color change, pallor, rash and wound.  Neurological:  Negative for dizziness, tremors, weakness and light-headedness.  Hematological:  Negative for adenopathy. Does not bruise/bleed easily.  Psychiatric/Behavioral:  Negative for agitation, behavioral problems, confusion, decreased concentration, dysphoric mood and sleep disturbance.       Objective:   Physical Exam Constitutional:      General: She is not in acute distress.    Appearance: She is not diaphoretic.  HENT:     Head: Normocephalic and atraumatic.     Right Ear: External ear normal.     Left Ear: External ear normal.     Nose: Nose normal.     Mouth/Throat:     Pharynx: No oropharyngeal exudate.  Eyes:     General: No scleral icterus.       Right eye: No discharge.        Left eye: No discharge.     Extraocular Movements: Extraocular movements intact.     Conjunctiva/sclera: Conjunctivae normal.  Cardiovascular:     Rate and Rhythm: Normal rate and regular rhythm.     Heart sounds:    No friction rub.  Pulmonary:     Effort: Pulmonary effort is normal. No respiratory distress.     Breath sounds: No wheezing or rales.  Abdominal:     General: There is  no distension.     Palpations: Abdomen is soft. There is no mass.     Tenderness: There is no abdominal tenderness. There is no rebound.     Hernia: No hernia is present.  Musculoskeletal:        General: No tenderness. Normal range of motion.     Cervical back: Normal range of motion and neck supple.  Lymphadenopathy:     Cervical: No cervical adenopathy.  Skin:    General: Skin is warm and dry.     Coloration: Skin is not jaundiced or pale.     Findings: No erythema, lesion or rash.  Neurological:     General: No focal deficit present.     Mental Status: She is alert and  oriented to person, place, and time.     Coordination: Coordination normal.  Psychiatric:        Mood and Affect: Mood normal.        Behavior: Behavior normal.        Thought Content: Thought content normal.        Judgment: Judgment normal.    PICC line 02/21/2021:         Assessment & Plan:   Tubo-ovarian abscess:  Has nearly resolved.  I reviewed the CT of the abdomen pelvis personally from February 21, 2021    I will continue her on oral ciprofloxacin I sent to [facility].  This will cost $13 for her there.  If she does good Rx.  She can continue metronidazole in the interim.  Written her letter dating that she is suitable to return to work on Monday.  HIV PrEP:   We will send another HIV antibody today she was seronegative in the hospital I gave her a month supply of DESCOVY samples and we are putting an application for aptitude.  Once we have we will give her first dose of aptitude at time 0 and then a month later followed by every 2 monthly injections.  Given that she is uninsured she would benefit ultimately the being switched over to the pharmacy prep clinic.  I have initially scheduled to see me in roughly a month's time.  Trichomonas: she is received adequate metronidazole   Obesity: would benefit from weight loss program  Hypertension:  BP up in clinic she is going to  establish with PCP Vitals:   02/21/21 0841  BP: (!) 141/90  Pulse: 80  Temp: 98 F (36.7 C)  SpO2: 100%

## 2021-02-24 ENCOUNTER — Telehealth: Payer: Self-pay

## 2021-02-24 LAB — HIV ANTIBODY (ROUTINE TESTING W REFLEX): HIV 1&2 Ab, 4th Generation: NONREACTIVE

## 2021-02-24 NOTE — Telephone Encounter (Addendum)
[facility] Patient Advocate Encounter  Completed and sent [employer] application for Apretude for this patient who is uninsured.    Patient is approved 02/24/21 through 02/24/22.  Medication will be delivered from [employer] [facility] .   Clearance Coots, CPhT Specialty Pharmacy Patient [facility] Phone: [phone] Fax:  [phone]

## 2021-02-25 ENCOUNTER — Ambulatory Visit: Payer: Self-pay | Admitting: Family Medicine

## 2021-02-25 NOTE — Progress Notes (Deleted)
GYNECOLOGY OFFICE VISIT NOTE  History:   Christy Schaefer is a 40 y.o. No obstetric history on file. here today for ***. She denies any abnormal vaginal discharge, bleeding, pelvic pain or other concerns.    Past Medical History:  Diagnosis Date   On pre-exposure prophylaxis for HIV 02/21/2021    Past Surgical History:  Procedure Laterality Date   CHOLECYSTECTOMY     IR RADIOLOGIST EVAL & MGMT  02/20/2021    The following portions of the patient's history were reviewed and updated as appropriate: allergies, current medications, past family history, past medical history, past social history, past surgical history and problem list.   Health Maintenance:  Normal pap and negative HRHPV on ***.  Normal mammogram on ***.   Review of Systems:  Pertinent items noted in HPI and remainder of comprehensive ROS otherwise negative.  Physical Exam:  LMP 01/22/2021 (Approximate)  CONSTITUTIONAL: Well-developed, well-nourished female in no acute distress.  HEENT:  Normocephalic, atraumatic. External right and left ear normal. No scleral icterus.  NECK: Normal range of motion, supple, no masses noted on observation SKIN: No rash noted. Not diaphoretic. No erythema. No pallor. MUSCULOSKELETAL: Normal range of motion. No edema noted. NEUROLOGIC: Alert and oriented to person, place, and time. Normal muscle tone coordination.  PSYCHIATRIC: Normal mood and affect. Normal behavior. Normal judgment and thought content. CARDIOVASCULAR: Normal heart rate noted RESPIRATORY: Effort and breath sounds normal, no problems with respiration noted ABDOMEN: No masses noted. No other overt distention noted.   PELVIC: {Blank single:19197::"Deferred","Normal appearing external genitalia; normal urethral meatus; normal appearing vaginal mucosa and cervix.  No abnormal discharge noted.  Normal uterine size, no other palpable masses, no uterine or adnexal tenderness. Performed in the presence of a chaperone"}  Labs and  Imaging Results for orders placed or performed in visit on 02/21/21 (from the past 168 hour(s))  HIV Antibody (routine testing w rflx)   Collection Time: 02/21/21  9:26 AM  Result Value Ref Range   HIV 1&2 Ab, 4th Generation NON-REACTIVE NON-REACTIVE   CT ABDOMEN PELVIS W CONTRAST  Result Date: 02/20/2021 CLINICAL DATA:  Indwelling drains.  History of TOA. EXAM: CT ABDOMEN AND PELVIS WITH CONTRAST TECHNIQUE: Multidetector CT imaging of the abdomen and pelvis was performed using the standard protocol following bolus administration of intravenous contrast. CONTRAST:  ISOVUE-300 IOPAMIDOL (ISOVUE-300) INJECTION 61% COMPARISON:  CT AP, 02/02/2021 and 01/25/2021. IR CT, 02/03/2021 and 01/30/2021. FINDINGS: Lower chest: No acute abnormality.  Trace bibasilar atelectasis. Hepatobiliary: No focal liver abnormality is seen. Status post cholecystectomy. No biliary dilatation. Pancreas: Unremarkable. No pancreatic ductal dilatation or surrounding inflammatory changes. Spleen: Normal in size without focal abnormality. Adrenals/Urinary Tract: Adrenal glands are unremarkable. Kidneys are normal, without renal calculi, focal lesion, or hydronephrosis. Bladder is unremarkable. Stomach/Bowel: Stomach is within normal limits. Appendix appears normal. No evidence of bowel wall thickening, distention, or inflammatory changes. Vascular/Lymphatic: Normal vascular findings. Similar findings of innumerable retroperitoneal lymph nodes. Reproductive: *Normal appearance of the uterus. *Near-complete resolution of previously-demonstrated multiloculated adnexal collections, the largest of which now measures 3.0 x 2.7 cm. *In comparison, the largest collection on initial comparison CT measured up to 10.2 x 9.1 cm on 01/25/2021. *Trace residual pelvic adnexal stranding. *Multiple transabdominal percutaneous drains (4), with all but 1 still positioned within the former collections. Other: No ascites. Inferolateral RIGHT lower  quadrant drain is retracted, with pigtail located within the RIGHT rectus sheath. Musculoskeletal: No acute osseous findings. IMPRESSION: 1. Near-complete resolution of prior multiloculated adnexal collections with  small volume residual, as above. 2. Retraction of inferolateral RIGHT lower quadrant, with pigtail within the RIGHT rectus sheath. 3. Persistence of innumerable retroperitoneal lymph nodes, favor reactive. Attention on follow-up. Electronically Signed   By: Roanna Banning M.D.   On: 02/20/2021 14:30   CT ABDOMEN PELVIS W CONTRAST  Result Date: 02/02/2021 CLINICAL DATA:  Intra-abdominal abscess. EXAM: CT ABDOMEN AND PELVIS WITH CONTRAST TECHNIQUE: Multidetector CT imaging of the abdomen and pelvis was performed using the standard protocol following bolus administration of intravenous contrast. CONTRAST:  OMNIPAQUE IOHEXOL 300 MG/ML  SOLN COMPARISON:  CT abdomen pelvis 05/16/2018, CT abdomen pelvis 01/25/2021, MRI abdomen 01/25/2021, CT image guided procedure 01/30/2021 FINDINGS: Lower chest: Bilateral trace pleural effusions. Bilateral lower lobe atelectasis. Hepatobiliary: No focal liver abnormality. Status post cholecystectomy. No biliary dilatation. Pancreas: No focal lesion. Normal pancreatic contour. No surrounding inflammatory changes. No main pancreatic ductal dilatation. Spleen: Normal in size without focal abnormality. Adrenals/Urinary Tract: No adrenal nodule bilaterally. Question interval developing trace delayed left nephrogram with bilateral distal ureters coursing along the pelvic mass. No striated nephrogram No hydronephrosis. No hydroureter. No definite urothelial thickening. No nephroureterolithiasis. The urinary bladder is unremarkable. Stomach/Bowel: PO contrast reaches the large bowel. Stomach is within normal limits. No evidence of bowel wall thickening or dilatation. Appendix appears normal. Vascular/Lymphatic: No abdominal aorta or iliac aneurysm.Multiple retroperitoneal  lymph nodes measure at the upper limits of normal. There is an enlarged left periaortic lymph node measuring up to 1.2 cm (4:46). Prominent but nonenlarged right pelvic wall lymph node (4:75). No abdominal, pelvic, or inguinal lymphadenopathy. Reproductive: Redemonstration of a complex multi-cystic pelvic mass measuring up to least 15.8 x 8.5 x 10.8 cm. Interval decrease in size of the superior most largest in inferior-most largest component. The lesion is noted to be inseparable from the fundus of the uterus. The bilateral ovaries are not definitely identified. Other: Interval placement of two pelvic pigtail catheters terminating within the known complex multicystic mass lesion. Trace free fluid within the pelvis. No free gas. Musculoskeletal: No abdominal wall hernia or abnormality. No suspicious lytic or blastic osseous lesions. No acute displaced fracture. Multilevel degenerative changes of the spine. IMPRESSION: 1. Interval placement of two pelvic catheters terminating within a known 15.8 x 8.5 x 3 8 cm complex multicystic pelvic mass lesion with slight interval decrease in size of the two largest component. As stated on MRI abdomen 01/25/2021, this could represent a tubo-ovarian abscess versus neoplasm versus endometriosis. 2. Multiple retroperitoneal borderline enlarged and prominent lymph nodes. Differential diagnosis includes reactive changes versus pathology. 3. Question interval developing trace delayed left nephrogram with bilateral distal ureters coursing along the pelvic mass. No definite hydronephrosis or urothelial thickening. 4. Trace simple free fluid pelvic ascites. Electronically Signed   By: Tish Frederickson M.D.   On: 02/02/2021 16:27   CT IMAGE GUIDED FLUID DRAIN BY CATHETER  Result Date: 02/03/2021 INDICATION: Complex tubo-ovarian abscesses, post CT-guided percutaneous drainage catheter placement x2 on 01/30/2021. Subsequent abdominal CT performed 02/02/2021 demonstrates several undrained  adnexal collections and as such patient presents again for repeat image guided drainage catheter placement. EXAM: CT-GUIDED ADNEXAL DRAINAGE CATHETER PLACEMENT X2 COMPARISON:  CT abdomen pelvis-02/02/2021; 05/16/2018 CT-guided percutaneous catheter placement x2-01/30/2021 Pelvic MRI-01/25/2021 MEDICATIONS: The patient is currently admitted to the hospital and receiving intravenous antibiotics. The antibiotics were administered within an appropriate time frame prior to the initiation of the procedure. ANESTHESIA/SEDATION: Moderate (conscious) sedation was employed during this procedure. A total of Versed 3 mg and Fentanyl 150 mcg  was administered intravenously. Moderate Sedation Time: 42 minutes. The patient's level of consciousness and vital signs were monitored continuously by radiology nursing throughout the procedure under my direct supervision. CONTRAST:  None COMPLICATIONS: None immediate. PROCEDURE: Informed written consent was obtained from the patient after a discussion of the risks, benefits and alternatives to treatment. The patient was placed supine on the CT gantry and a pre procedural CT was performed re-demonstrating the known ill-defined complex abscesses within the adnexa bilaterally with dominant ill-defined collection within the right adnexa measuring approximately 5.1 x 4.3 cm (image 30, series 2 and multiloculated collection along the midline of the lower abdomen/pelvis with right lateral component measuring approximately 6.3 x 4.3 cm (image 22, series 2, dominant collection within the midline of the abdomen measuring approximately 7.8 x 5.6 cm (image 13, series 2 and collection within the left side of the pelvis measuring approximately 3.6 x 3.4 cm (image 20, series 2). The CT gantry table position was marked and the ill-defined collections were identified sonographically. The procedure was planned. A timeout was performed prior to the initiation of the procedure. The skin overlying the ventral  aspect the lower abdomen/pelvis was prepped and draped in the usual sterile fashion. The overlying soft tissues were anesthetized with 1% lidocaine with epinephrine. Under direct ultrasound guidance, the dominant collection within the right adnexa was targeted with an 18 gauge trocar needle allowing placement of a short Amplatz wire. Multiple ultrasound images were saved for procedural documentation purposes Next, under direct ultrasound guidance, the adjacent collections within the more midline of the lower abdomen/pelvis was targeted utilizing a right to left approach. This allowed for placement of a regular length Amplatz wire. Multiple ultrasound images were saved procedural documentation purposes. Appropriate position was confirmed with CT imaging. Both tracks were dilated ultimately allowing placement of a 12 French percutaneous drainage catheter within the dominant collection within right pelvis as well as a 12 [place] biliary type multi side-hole drainage catheter traversing multiple adjacent adnexal collections. Approximately 10 cc of slightly blood-tinged serous fluid was aspirated from the dominant collection within the right pelvis. A total of approximately 200 cc of slightly blood-tinged serous fluid was aspirated from all the adjacent ill-defined collections within the more midline of the adnexa as the drainage catheter was slowly retracted ultimately with radiopaque side marker positioned adjacent to the entrance site of the accessed adnexal collection. A representative sample was capped and sent to the laboratory for analysis. Completion images were obtained (series 7), and the procedure was terminated. Both drainage catheters were secured at the skin entrance sites with interrupted sutures and both drainage catheters were connected to JP bulbs. Dressings were applied. The patient tolerated the procedure well without immediate post procedural complication. IMPRESSION: 1. Successful ultrasound and CT  guided placement of a 12 French all purpose drain catheter into the dominant collection within the right pelvis yielding 10 cc of blood tinged serous fluid. 2. Successful ultrasound and CT-guided placement of a 12 French multi side-hole biliary type drainage catheter traversing several adjacent adnexal collections yielding a total of 200 cc of blood tinged serous fluid. A representative sample of aspirated fluid was capped and sent to the laboratory for analysis. Electronically Signed   By: Simonne Come M.D.   On: 02/03/2021 13:26   CT IMAGE GUIDED DRAINAGE BY PERCUTANEOUS CATHETER  Result Date: 02/03/2021 INDICATION: Complex tubo-ovarian abscesses, post CT-guided percutaneous drainage catheter placement x2 on 01/30/2021. Subsequent abdominal CT performed 02/02/2021 demonstrates several undrained adnexal collections and as such  patient presents again for repeat image guided drainage catheter placement. EXAM: CT-GUIDED ADNEXAL DRAINAGE CATHETER PLACEMENT X2 COMPARISON:  CT abdomen pelvis-02/02/2021; 05/16/2018 CT-guided percutaneous catheter placement x2-01/30/2021 Pelvic MRI-01/25/2021 MEDICATIONS: The patient is currently admitted to the hospital and receiving intravenous antibiotics. The antibiotics were administered within an appropriate time frame prior to the initiation of the procedure. ANESTHESIA/SEDATION: Moderate (conscious) sedation was employed during this procedure. A total of Versed 3 mg and Fentanyl 150 mcg was administered intravenously. Moderate Sedation Time: 42 minutes. The patient's level of consciousness and vital signs were monitored continuously by radiology nursing throughout the procedure under my direct supervision. CONTRAST:  None COMPLICATIONS: None immediate. PROCEDURE: Informed written consent was obtained from the patient after a discussion of the risks, benefits and alternatives to treatment. The patient was placed supine on the CT gantry and a pre procedural CT was performed  re-demonstrating the known ill-defined complex abscesses within the adnexa bilaterally with dominant ill-defined collection within the right adnexa measuring approximately 5.1 x 4.3 cm (image 30, series 2 and multiloculated collection along the midline of the lower abdomen/pelvis with right lateral component measuring approximately 6.3 x 4.3 cm (image 22, series 2, dominant collection within the midline of the abdomen measuring approximately 7.8 x 5.6 cm (image 13, series 2 and collection within the left side of the pelvis measuring approximately 3.6 x 3.4 cm (image 20, series 2). The CT gantry table position was marked and the ill-defined collections were identified sonographically. The procedure was planned. A timeout was performed prior to the initiation of the procedure. The skin overlying the ventral aspect the lower abdomen/pelvis was prepped and draped in the usual sterile fashion. The overlying soft tissues were anesthetized with 1% lidocaine with epinephrine. Under direct ultrasound guidance, the dominant collection within the right adnexa was targeted with an 18 gauge trocar needle allowing placement of a short Amplatz wire. Multiple ultrasound images were saved for procedural documentation purposes Next, under direct ultrasound guidance, the adjacent collections within the more midline of the lower abdomen/pelvis was targeted utilizing a right to left approach. This allowed for placement of a regular length Amplatz wire. Multiple ultrasound images were saved procedural documentation purposes. Appropriate position was confirmed with CT imaging. Both tracks were dilated ultimately allowing placement of a 12 French percutaneous drainage catheter within the dominant collection within right pelvis as well as a 12 [place] biliary type multi side-hole drainage catheter traversing multiple adjacent adnexal collections. Approximately 10 cc of slightly blood-tinged serous fluid was aspirated from the dominant  collection within the right pelvis. A total of approximately 200 cc of slightly blood-tinged serous fluid was aspirated from all the adjacent ill-defined collections within the more midline of the adnexa as the drainage catheter was slowly retracted ultimately with radiopaque side marker positioned adjacent to the entrance site of the accessed adnexal collection. A representative sample was capped and sent to the laboratory for analysis. Completion images were obtained (series 7), and the procedure was terminated. Both drainage catheters were secured at the skin entrance sites with interrupted sutures and both drainage catheters were connected to JP bulbs. Dressings were applied. The patient tolerated the procedure well without immediate post procedural complication. IMPRESSION: 1. Successful ultrasound and CT guided placement of a 12 French all purpose drain catheter into the dominant collection within the right pelvis yielding 10 cc of blood tinged serous fluid. 2. Successful ultrasound and CT-guided placement of a 12 French multi side-hole biliary type drainage catheter traversing several adjacent adnexal collections yielding  a total of 200 cc of blood tinged serous fluid. A representative sample of aspirated fluid was capped and sent to the laboratory for analysis. Electronically Signed   By: Simonne Come M.D.   On: 02/03/2021 13:26   CT IMAGE GUIDED DRAINAGE BY PERCUTANEOUS CATHETER  Result Date: 01/30/2021 INDICATION: Tubo-ovarian abscess EXAM: CT IMAGE GUIDED DRAINAGE BY PERCUTANEOUS CATHETER PLACEMENT X2 COMPARISON:  CT abdomen and pelvis, 01/25/2021. MR abdomen and pelvis, 01/25/2021. MEDICATIONS: The patient is currently admitted to the hospital and receiving intravenous antibiotics. The antibiotics were administered within an appropriate time frame prior to the initiation of the procedure. ANESTHESIA/SEDATION: Moderate (conscious) sedation was employed during this procedure. A total of Versed 3 mg and  Fentanyl 175 mcg was administered intravenously. Moderate Sedation Time: 61 minutes. The patient's level of consciousness and vital signs were monitored continuously by radiology nursing throughout the procedure under my direct supervision. CONTRAST:  None COMPLICATIONS: None immediate. PROCEDURE: Informed written consent was obtained from the patient after a discussion of the risks, benefits and alternatives to treatment. The patient was placed supine on the CT gantry and a pre procedural CT was performed re-demonstrating the known abscess/fluid collections within the pelvis. The procedure was planned. A timeout was performed prior to the initiation of the procedure. The anterior abdomen and pelvis were prepped and draped in the usual sterile fashion. At the midline pelvis, the overlying soft tissues were anesthetized with 1% lidocaine with epinephrine. Appropriate trajectory was planned with the use of a 22 gauge spinal needle. An 18 gauge trocar needle was advanced into the abscess/fluid collection and a short Amplatz super stiff wire was coiled within the collection. Appropriate positioning was confirmed with a limited CT scan. The tract was serially dilated allowing placement of a 10 [place] all-purpose drainage catheter. The process was repeated at the midline supraumbilical abdomen, the overlying soft tissues were anesthetized with 1% lidocaine with epinephrine. Appropriate trajectory was planned with the use of a 22 gauge spinal needle. An 18 gauge trocar needle was advanced into the abscess/fluid collection and a short Amplatz super stiff wire was coiled within the collection. Appropriate positioning was confirmed with a limited CT scan. The tract was serially dilated allowing placement of another 10 [place] all-purpose drainage catheter. Appropriate positioning of the drains was confirmed with a limited postprocedural CT scan. Thin serous fluid and purulent fluid were aspirated at the midline pelvis and  supraumbilical collections, respectively. The tubes were connected to drainage bags and sutured in place. A dressing was placed. The patient tolerated the procedure well without immediate post procedural complication. IMPRESSION: 1. Successful CT guided placement of 10 Fr drain catheters into the midline pelvis and supraumbilical abdomen for tubo-ovarian abscess, as above. 2. Purulent fluid was aspirated at the supraumbilical abdomen. Samples were sent to the laboratory as requested by the ordering clinical team. Roanna Banning, MD [facility] Radiology Electronically Signed   By: Roanna Banning M.D.   On: 01/30/2021 17:09   CT IMAGE GUIDED DRAINAGE BY PERCUTANEOUS CATHETER  Result Date: 01/30/2021 INDICATION: Tubo-ovarian abscess EXAM: CT IMAGE GUIDED DRAINAGE BY PERCUTANEOUS CATHETER PLACEMENT X2 COMPARISON:  CT abdomen and pelvis, 01/25/2021. MR abdomen and pelvis, 01/25/2021. MEDICATIONS: The patient is currently admitted to the hospital and receiving intravenous antibiotics. The antibiotics were administered within an appropriate time frame prior to the initiation of the procedure. ANESTHESIA/SEDATION: Moderate (conscious) sedation was employed during this procedure. A total of Versed 3 mg and Fentanyl 175 mcg was administered  intravenously. Moderate Sedation Time: 61 minutes. The patient's level of consciousness and vital signs were monitored continuously by radiology nursing throughout the procedure under my direct supervision. CONTRAST:  None COMPLICATIONS: None immediate. PROCEDURE: Informed written consent was obtained from the patient after a discussion of the risks, benefits and alternatives to treatment. The patient was placed supine on the CT gantry and a pre procedural CT was performed re-demonstrating the known abscess/fluid collections within the pelvis. The procedure was planned. A timeout was performed prior to the initiation of the procedure. The  anterior abdomen and pelvis were prepped and draped in the usual sterile fashion. At the midline pelvis, the overlying soft tissues were anesthetized with 1% lidocaine with epinephrine. Appropriate trajectory was planned with the use of a 22 gauge spinal needle. An 18 gauge trocar needle was advanced into the abscess/fluid collection and a short Amplatz super stiff wire was coiled within the collection. Appropriate positioning was confirmed with a limited CT scan. The tract was serially dilated allowing placement of a 10 [place] all-purpose drainage catheter. The process was repeated at the midline supraumbilical abdomen, the overlying soft tissues were anesthetized with 1% lidocaine with epinephrine. Appropriate trajectory was planned with the use of a 22 gauge spinal needle. An 18 gauge trocar needle was advanced into the abscess/fluid collection and a short Amplatz super stiff wire was coiled within the collection. Appropriate positioning was confirmed with a limited CT scan. The tract was serially dilated allowing placement of another 10 [place] all-purpose drainage catheter. Appropriate positioning of the drains was confirmed with a limited postprocedural CT scan. Thin serous fluid and purulent fluid were aspirated at the midline pelvis and supraumbilical collections, respectively. The tubes were connected to drainage bags and sutured in place. A dressing was placed. The patient tolerated the procedure well without immediate post procedural complication. IMPRESSION: 1. Successful CT guided placement of 10 Fr drain catheters into the midline pelvis and supraumbilical abdomen for tubo-ovarian abscess, as above. 2. Purulent fluid was aspirated at the supraumbilical abdomen. Samples were sent to the laboratory as requested by the ordering clinical team. Roanna Banning, MD [facility] Radiology Electronically Signed   By: Roanna Banning M.D.   On: 01/30/2021 17:09   [place] EKG  SITE RITE  Result Date: 02/04/2021 If Site Rite image not attached, placement could not be confirmed due to current cardiac rhythm.  IR Radiologist Eval & Mgmt  Result Date: 02/20/2021 Please refer to notes tab for details about interventional procedure. (Op Note)     Assessment and Plan:    There are no diagnoses linked to this encounter.  Routine preventative health maintenance measures emphasized. Please refer to After Visit Summary for other counseling recommendations.   No follow-ups on file.     Leticia Penna, DO  OB Fellow, [employer] [place] [facility], [facility] 02/25/2021 1:12 PM

## 2021-03-04 NOTE — Telephone Encounter (Signed)
LVM earlier today requesting call back to discuss new approval.

## 2021-03-05 ENCOUNTER — Ambulatory Visit: Payer: Self-pay

## 2021-03-11 NOTE — Telephone Encounter (Signed)
Ok I can have her medication delivered to the clinic so we can have it here for 12/15

## 2021-03-11 NOTE — Telephone Encounter (Signed)
LVM again requesting call back to discuss new approval given she has an appointment with Dr. Daiva Eves on 12/15.

## 2021-03-12 ENCOUNTER — Ambulatory Visit: Payer: Self-pay | Admitting: Family Medicine

## 2021-03-12 ENCOUNTER — Telehealth: Payer: Self-pay

## 2021-03-12 NOTE — Telephone Encounter (Signed)
[facility] Patient Advocate Encounter  Patient's medication (Apretude) have been couriered to [facility] from [facility] and will be administered on the patient next office visit on 03/20/21.  We received 2 boxes of Apretude one for December and January .  Clearance Coots , CPhT [facility] Patient [facility] Phone: [phone] Fax:  [phone]

## 2021-03-20 ENCOUNTER — Other Ambulatory Visit: Payer: Self-pay

## 2021-03-20 ENCOUNTER — Ambulatory Visit: Payer: Self-pay | Admitting: Infectious Disease

## 2021-03-20 ENCOUNTER — Telehealth: Payer: Self-pay

## 2021-03-20 NOTE — Telephone Encounter (Signed)
Patient no showed her appointment with Dr. Daiva Eves this morning.

## 2021-03-20 NOTE — Telephone Encounter (Signed)
Called patient to see if she would be able to make it to her appointment this morning, no answer. Left HIPAA compliant voicemail requesting callback.   Sandie Ano, RN

## 2021-03-24 ENCOUNTER — Telehealth: Payer: Self-pay

## 2021-03-24 NOTE — Telephone Encounter (Signed)
Called patient to get missed appointments rescheduled, left patient a voicemail to call back to get scheduled. Can be seen anytime, newer patient.

## 2021-03-27 ENCOUNTER — Ambulatory Visit: Payer: Self-pay | Admitting: Pharmacist

## 2021-03-27 ENCOUNTER — Other Ambulatory Visit: Payer: Self-pay

## 2021-05-12 ENCOUNTER — Other Ambulatory Visit: Payer: Self-pay | Admitting: Pharmacist

## 2021-05-12 DIAGNOSIS — Z79899 Other long term (current) drug therapy: Secondary | ICD-10-CM

## 2021-05-12 MED ORDER — DESCOVY 200-25 MG PO TABS: 1.0000 | ORAL_TABLET | Freq: Every day | ORAL | 0 refills | Status: AC

## 2021-05-12 NOTE — Progress Notes (Signed)
Medication Samples have been provided to the patient.  Drug name: Descovy        Strength: 200/25 mg       Qty: 28 tablets (4 packs)   LOT: [id number]   Exp.Date: 06/05/2022  Dosing instructions: Take one tablet by mouth once daily  The patient has been instructed regarding the correct time, dose, and frequency of taking this medication, including desired effects and most common side effects.   Lacresha Fusilier L. Jannette Fogo, PharmD, BCIDP, AAHIVP, CPP Clinical Pharmacist Practitioner Infectious Diseases Clinical Pharmacist [facility] 03/18/2020, 10:07 AM

## 2022-03-23 ENCOUNTER — Encounter (HOSPITAL_BASED_OUTPATIENT_CLINIC_OR_DEPARTMENT_OTHER): Payer: Self-pay | Admitting: Urology

## 2022-03-23 ENCOUNTER — Other Ambulatory Visit: Payer: Self-pay

## 2022-03-23 DIAGNOSIS — X500XXA Overexertion from strenuous movement or load, initial encounter: Secondary | ICD-10-CM | POA: Insufficient documentation

## 2022-03-23 DIAGNOSIS — Z5321 Procedure and treatment not carried out due to patient leaving prior to being seen by health care provider: Secondary | ICD-10-CM | POA: Insufficient documentation

## 2022-03-23 DIAGNOSIS — M545 Low back pain, unspecified: Secondary | ICD-10-CM | POA: Insufficient documentation

## 2022-03-23 DIAGNOSIS — Y990 Civilian activity done for income or pay: Secondary | ICD-10-CM | POA: Insufficient documentation

## 2022-03-23 LAB — URINALYSIS, ROUTINE W REFLEX MICROSCOPIC
Bilirubin Urine: NEGATIVE
Glucose, UA: NEGATIVE mg/dL
Ketones, ur: NEGATIVE mg/dL
Leukocytes,Ua: NEGATIVE
Nitrite: NEGATIVE
Protein, ur: NEGATIVE mg/dL
Specific Gravity, Urine: 1.02 (ref 1.005–1.030)
pH: 6.5 (ref 5.0–8.0)

## 2022-03-23 LAB — URINALYSIS, MICROSCOPIC (REFLEX)

## 2022-03-23 NOTE — ED Triage Notes (Signed)
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

## 2022-03-24 ENCOUNTER — Emergency Department (HOSPITAL_BASED_OUTPATIENT_CLINIC_OR_DEPARTMENT_OTHER)
Admission: EM | Admit: 2022-03-24 | Discharge: 2022-03-24 | Discharge status: Against Medical Advice | Payer: Self-pay | Attending: Emergency Medicine | Admitting: Emergency Medicine

## 2022-03-24 ENCOUNTER — Encounter (HOSPITAL_BASED_OUTPATIENT_CLINIC_OR_DEPARTMENT_OTHER): Payer: Self-pay | Admitting: Emergency Medicine

## 2022-03-24 HISTORY — DX: Essential (primary) hypertension: I10

## 2022-03-24 NOTE — ED Notes (Signed)
Pt called for in lobby x 2 with no answer

## 2022-03-24 NOTE — ED Notes (Signed)
Pt called for x 2 in lobby and by vending with no answer

## 2022-12-10 IMAGING — CT CT IMAGE GUIDED FLUID DRAIN BY CATHETER
3 of 6 series · 11 of 32 positions shown, 16 images · non-contrast
Comparison: CT abdomen pelvis-02/02/2021;

INDICATION: Complex tubo-ovarian abscesses, post CT-guided percutaneous drainage
catheter placement x2 on 01/30/2021.

Subsequent abdominal CT performed 02/02/2021 demonstrates several
undrained adnexal collections and as such patient presents again for
repeat image guided drainage catheter placement.
EXAM:
CT-GUIDED ADNEXAL DRAINAGE CATHETER PLACEMENT X2

[Series 2: i-spiral 5.0 b40f · axial · 0.75mm/px · z∈[+812,+955]mm · 5 of 63 slices shown, 10 images (1 of 3)]
[im 11/63  soft-tissue]
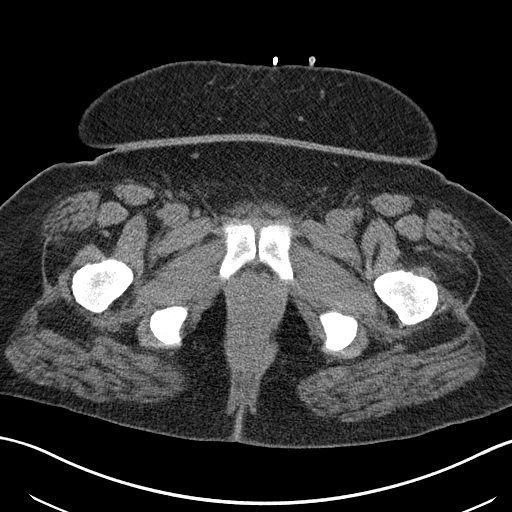
[im 11/63  bone]
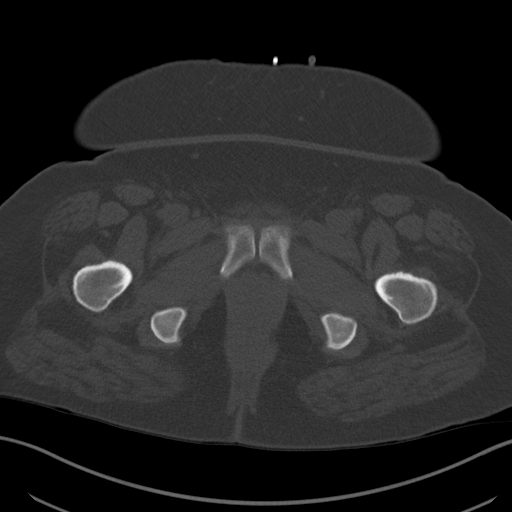
[im 21/63  soft-tissue]
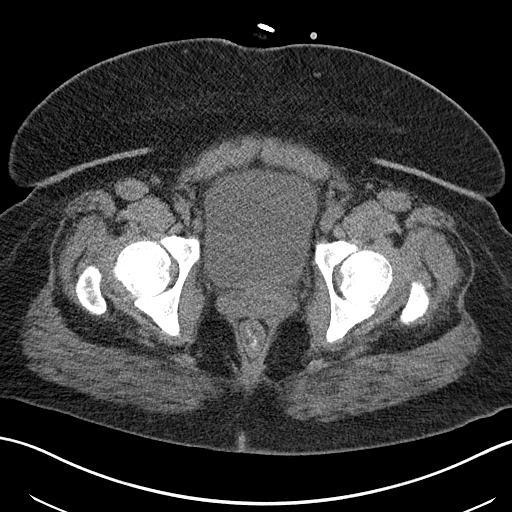
[im 21/63  lung]
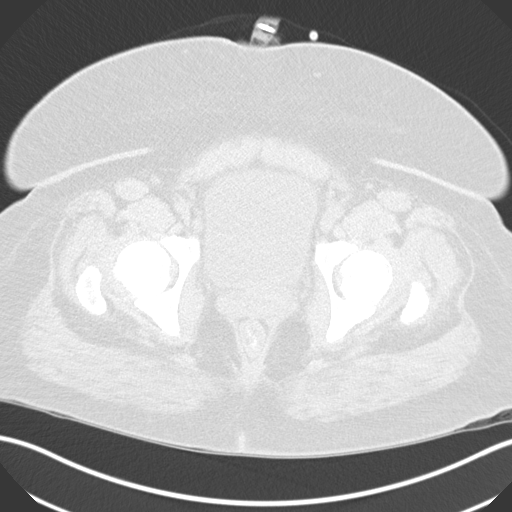
[im 32/63  soft-tissue]
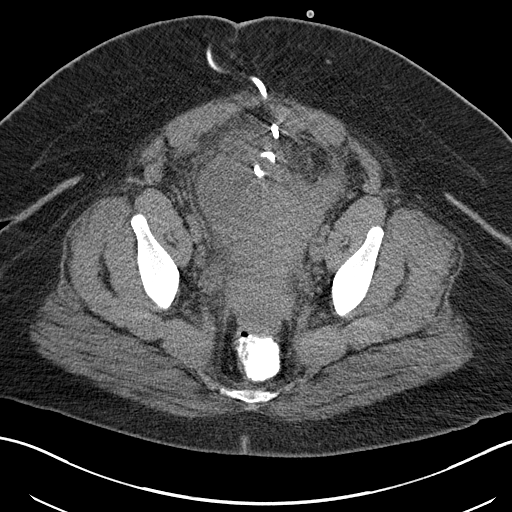
[im 32/63  lung]
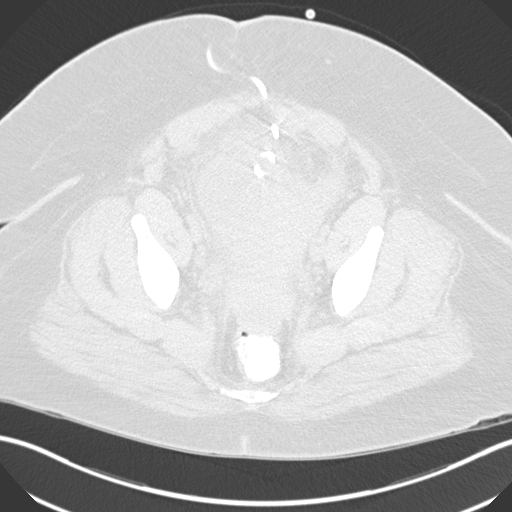
[im 42/63  soft-tissue]
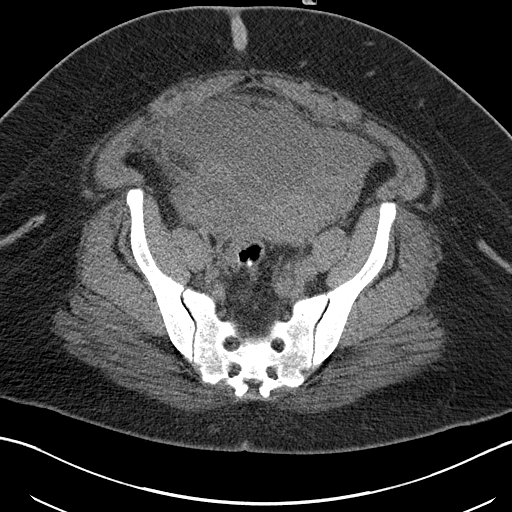
[im 42/63  lung]
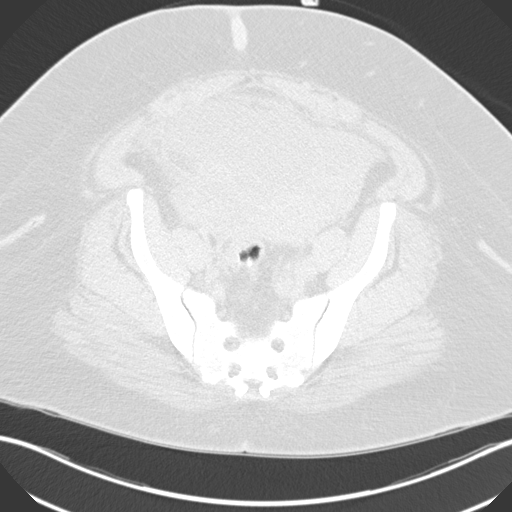
[im 52/63  soft-tissue]
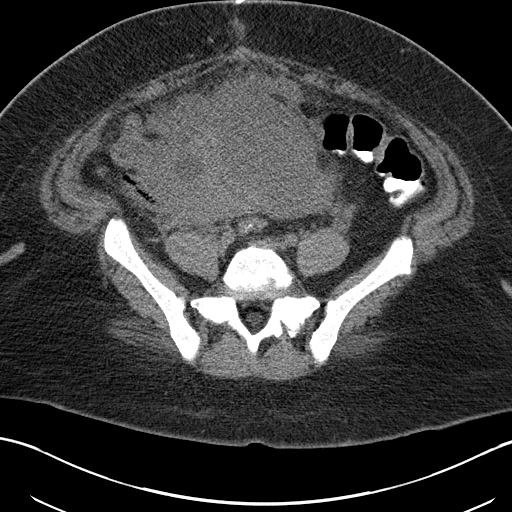
[im 52/63  lung]
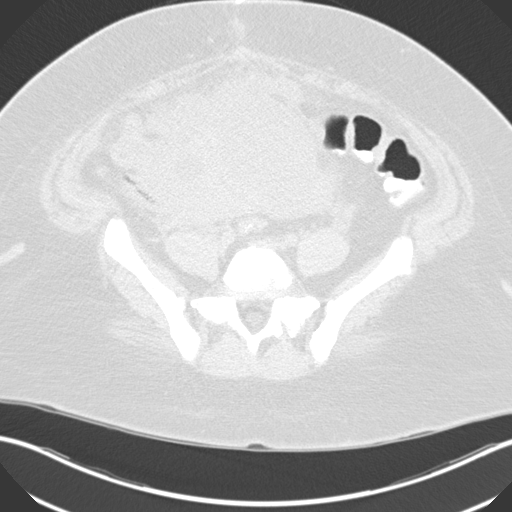

[Series 3: i-spiral 5.0 b40f · axial · 0.98mm/px · z∈[+876,+956]mm · 3 of 47 slices shown (2 of 3)]
[im 12/47  soft-tissue]
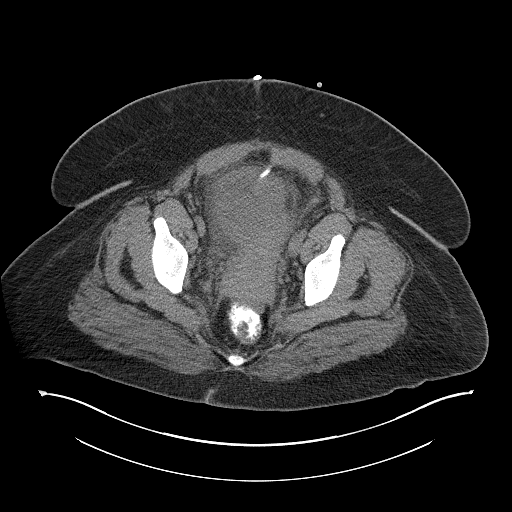
[im 24/47  soft-tissue]
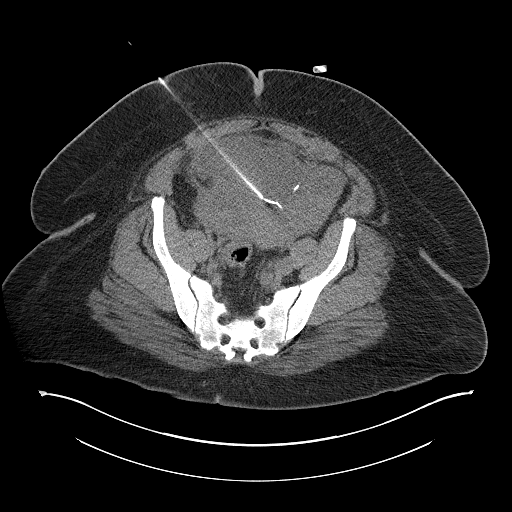
[im 35/47  soft-tissue]
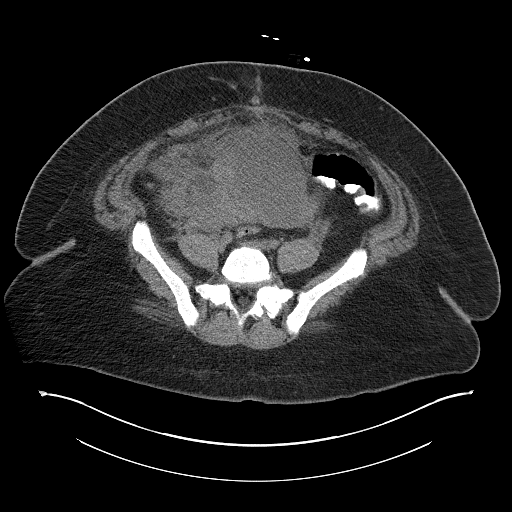

[Series 4: i-spiral 5.0 b40f · axial · 0.98mm/px · z∈[+876,+956]mm · 3 of 47 slices shown (3 of 3)]
[im 12/47  soft-tissue]
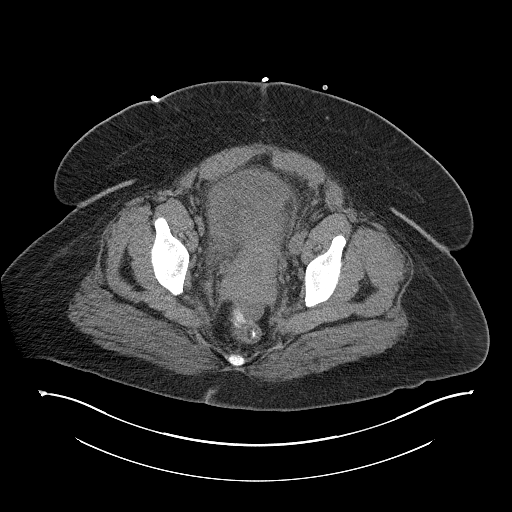
[im 24/47  soft-tissue]
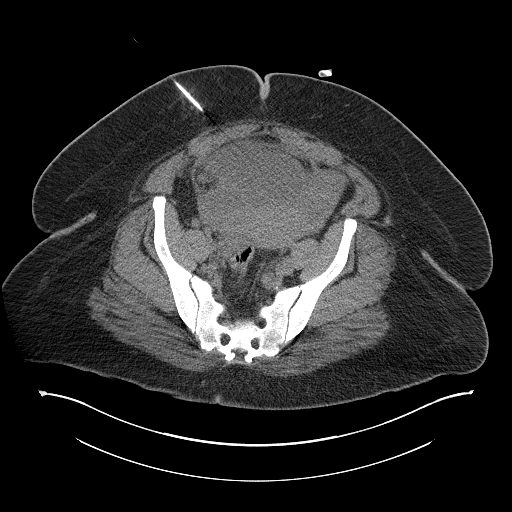
[im 35/47  soft-tissue]
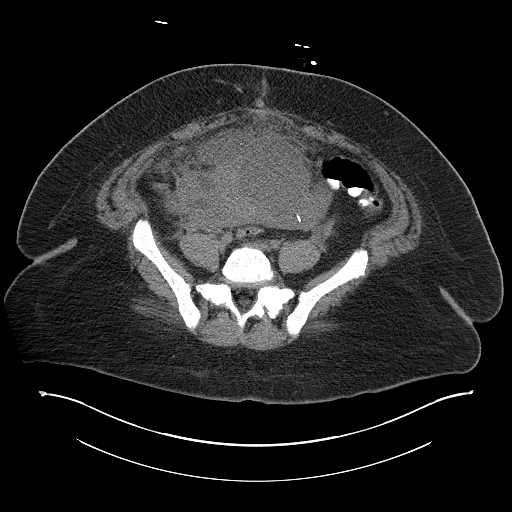

[11 of 32 positions shown; findings below may reference images not displayed]

05/16/2018

CT-guided percutaneous catheter placement x2-10/27/1711

Pelvic MRI-01/25/2021

MEDICATIONS:
The patient is currently admitted to the hospital and receiving
intravenous antibiotics. The antibiotics were administered within an
appropriate time frame prior to the initiation of the procedure.

ANESTHESIA/SEDATION:
Moderate (conscious) sedation was employed during this procedure. A
total of Versed 3 mg and Fentanyl 150 mcg was administered
intravenously.

Moderate Sedation Time: 42 minutes. The patient's level of
consciousness and vital signs were monitored continuously by
radiology nursing throughout the procedure under my direct
supervision.

CONTRAST:  None

COMPLICATIONS:
None immediate.

PROCEDURE:
Informed written consent was obtained from the patient after a
discussion of the risks, benefits and alternatives to treatment. The
patient was placed supine on the CT gantry and a pre procedural CT
was performed re-demonstrating the known ill-defined complex
abscesses within the adnexa bilaterally with dominant ill-defined
collection within the right adnexa measuring approximately 5.1 x
cm (image 30, series 2 and multiloculated collection along the
midline of the lower abdomen/pelvis with right lateral component
measuring approximately 6.3 x 4.3 cm (image 22, series 2, dominant
collection within the midline of the abdomen measuring approximately
7.8 x 5.6 cm (image 13, series 2 and collection within the left side
of the pelvis measuring approximately 3.6 x 3.4 cm (image 20, series
2). The CT gantry table position was marked and the ill-defined
collections were identified sonographically. The procedure was
planned. A timeout was performed prior to the initiation of the
procedure.

The skin overlying the ventral aspect the lower abdomen/pelvis was
prepped and draped in the usual sterile fashion. The overlying soft
tissues were anesthetized with 1% lidocaine with epinephrine.

Under direct ultrasound guidance, the dominant collection within the
right adnexa was targeted with an 18 gauge trocar needle allowing
placement of a short Amplatz wire. Multiple ultrasound images were
saved for procedural documentation purposes

Next, under direct ultrasound guidance, the adjacent collections
within the more midline of the lower abdomen/pelvis was targeted
utilizing a right to left approach. This allowed for placement of a
regular length Amplatz wire. Multiple ultrasound images were saved
procedural documentation purposes.

Appropriate position was confirmed with CT imaging.

Both tracks were dilated ultimately allowing placement of a 12
French percutaneous drainage catheter within the dominant collection
within right pelvis as well as a 12 French biliary type multi
side-hole drainage catheter traversing multiple adjacent adnexal
collections.

Approximately 10 cc of slightly blood-tinged serous fluid was
aspirated from the dominant collection within the right pelvis.

A total of approximately 200 cc of slightly blood-tinged serous
fluid was aspirated from all the adjacent ill-defined collections
within the more midline of the adnexa as the drainage catheter was
slowly retracted ultimately with radiopaque side marker positioned
adjacent to the entrance site of the accessed adnexal collection. A
representative sample was capped and sent to the laboratory for
analysis.

Completion images were obtained (series 7), and the procedure was
terminated.

Both drainage catheters were secured at the skin entrance sites with
interrupted sutures and both drainage catheters were connected to DON LOLITO
bulbs.

Dressings were applied. The patient tolerated the procedure well
without immediate post procedural complication.
IMPRESSION: 1. Successful ultrasound and CT guided placement of a 12 French all
purpose drain catheter into the dominant collection within the right
pelvis yielding 10 cc of blood tinged serous fluid.
2. Successful ultrasound and CT-guided placement of a 12 French
multi side-hole biliary type drainage catheter traversing several
adjacent adnexal collections yielding a total of 200 cc of blood
tinged serous fluid. A representative sample of aspirated fluid was
capped and sent to the laboratory for analysis.
